# Patient Record
Sex: Female | Born: 1970
Health system: Southern US, Community
[De-identification: ages and names within clinical notes are randomized; demographics above are authoritative.]

## PROBLEM LIST (undated history)

## (undated) DIAGNOSIS — E079 Disorder of thyroid, unspecified: Secondary | ICD-10-CM

## (undated) DIAGNOSIS — F32A Depression, unspecified: Secondary | ICD-10-CM

## (undated) DIAGNOSIS — M79673 Pain in unspecified foot: Secondary | ICD-10-CM

## (undated) DIAGNOSIS — F329 Major depressive disorder, single episode, unspecified: Secondary | ICD-10-CM

## (undated) DIAGNOSIS — I10 Essential (primary) hypertension: Secondary | ICD-10-CM

## (undated) DIAGNOSIS — E119 Type 2 diabetes mellitus without complications: Secondary | ICD-10-CM

## (undated) DIAGNOSIS — J45909 Unspecified asthma, uncomplicated: Secondary | ICD-10-CM

## (undated) DIAGNOSIS — M1990 Unspecified osteoarthritis, unspecified site: Secondary | ICD-10-CM

## (undated) DIAGNOSIS — E669 Obesity, unspecified: Secondary | ICD-10-CM

## (undated) HISTORY — DX: Depression, unspecified: F32.A

## (undated) HISTORY — DX: Unspecified osteoarthritis, unspecified site: M19.90

## (undated) HISTORY — DX: Type 2 diabetes mellitus without complications: E11.9

## (undated) HISTORY — DX: Obesity, unspecified: E66.9

## (undated) HISTORY — DX: Essential (primary) hypertension: I10

## (undated) HISTORY — DX: Pain in unspecified foot: M79.673

## (undated) HISTORY — DX: Major depressive disorder, single episode, unspecified: F32.9

## (undated) HISTORY — DX: Unspecified asthma, uncomplicated: J45.909

## (undated) HISTORY — PX: ANKLE FRACTURE SURGERY: SHX122

---

## 2004-10-29 ENCOUNTER — Ambulatory Visit: Payer: Self-pay | Admitting: Family Medicine

## 2005-05-07 ENCOUNTER — Ambulatory Visit: Payer: Self-pay | Admitting: Internal Medicine

## 2005-06-07 ENCOUNTER — Encounter: Payer: Self-pay | Admitting: Family Medicine

## 2005-06-07 LAB — CONVERTED CEMR LAB

## 2005-12-19 ENCOUNTER — Ambulatory Visit: Payer: Self-pay | Admitting: Family Medicine

## 2006-05-06 ENCOUNTER — Ambulatory Visit: Payer: Self-pay | Admitting: Family Medicine

## 2006-12-18 ENCOUNTER — Ambulatory Visit: Payer: Self-pay | Admitting: Family Medicine

## 2007-01-14 ENCOUNTER — Ambulatory Visit: Payer: Self-pay | Admitting: Family Medicine

## 2007-06-23 ENCOUNTER — Telehealth (INDEPENDENT_AMBULATORY_CARE_PROVIDER_SITE_OTHER): Payer: Self-pay | Admitting: *Deleted

## 2007-07-27 ENCOUNTER — Telehealth (INDEPENDENT_AMBULATORY_CARE_PROVIDER_SITE_OTHER): Payer: Self-pay | Admitting: *Deleted

## 2007-11-17 ENCOUNTER — Encounter: Payer: Self-pay | Admitting: Family Medicine

## 2007-11-17 DIAGNOSIS — F4323 Adjustment disorder with mixed anxiety and depressed mood: Secondary | ICD-10-CM

## 2007-12-01 ENCOUNTER — Ambulatory Visit: Payer: Self-pay | Admitting: Family Medicine

## 2007-12-01 ENCOUNTER — Other Ambulatory Visit: Admission: RE | Admit: 2007-12-01 | Discharge: 2007-12-01 | Payer: Self-pay | Admitting: Family Medicine

## 2007-12-01 ENCOUNTER — Encounter: Payer: Self-pay | Admitting: Family Medicine

## 2007-12-03 LAB — CONVERTED CEMR LAB
AST: 14 units/L (ref 0–37)
Albumin: 3.9 g/dL (ref 3.5–5.2)
Alkaline Phosphatase: 117 units/L (ref 39–117)
BUN: 7 mg/dL (ref 6–23)
Basophils Absolute: 0 10*3/uL (ref 0.0–0.1)
Basophils Relative: 0.5 % (ref 0.0–1.0)
CO2: 30 meq/L (ref 19–32)
Chloride: 101 meq/L (ref 96–112)
Creatinine, Ser: 0.6 mg/dL (ref 0.4–1.2)
HCT: 35.9 % — ABNORMAL LOW (ref 36.0–46.0)
MCHC: 35 g/dL (ref 30.0–36.0)
Neutrophils Relative %: 64.4 % (ref 43.0–77.0)
RBC: 4.16 M/uL (ref 3.87–5.11)
RDW: 12.3 % (ref 11.5–14.6)
Total Bilirubin: 0.7 mg/dL (ref 0.3–1.2)
Total CHOL/HDL Ratio: 4.6
Total Protein: 7.6 g/dL (ref 6.0–8.3)
Triglycerides: 117 mg/dL (ref 0–149)
VLDL: 23 mg/dL (ref 0–40)

## 2007-12-09 DIAGNOSIS — I10 Essential (primary) hypertension: Secondary | ICD-10-CM

## 2007-12-09 HISTORY — DX: Essential (primary) hypertension: I10

## 2007-12-10 ENCOUNTER — Encounter (INDEPENDENT_AMBULATORY_CARE_PROVIDER_SITE_OTHER): Payer: Self-pay | Admitting: *Deleted

## 2008-01-28 ENCOUNTER — Ambulatory Visit: Payer: Self-pay | Admitting: Family Medicine

## 2008-01-28 DIAGNOSIS — I10 Essential (primary) hypertension: Secondary | ICD-10-CM | POA: Insufficient documentation

## 2008-01-28 DIAGNOSIS — R4589 Other symptoms and signs involving emotional state: Secondary | ICD-10-CM

## 2008-02-15 ENCOUNTER — Ambulatory Visit: Payer: Self-pay | Admitting: Family Medicine

## 2008-02-17 ENCOUNTER — Ambulatory Visit: Payer: Self-pay | Admitting: Family Medicine

## 2008-02-17 LAB — CONVERTED CEMR LAB
Basophils Relative: 0.2 % (ref 0.0–1.0)
HDL: 32.7 mg/dL — ABNORMAL LOW (ref >39.0)
LDL Cholesterol: 110 mg/dL — ABNORMAL HIGH (ref 0–99)
Monocytes Relative: 5.5 % (ref 3.0–11.0)
Neutro Abs: 6 10*3/uL (ref 1.4–7.7)
Platelets: 439 10*3/uL — ABNORMAL HIGH (ref 150–400)
RBC: 4.58 M/uL (ref 3.87–5.11)
RDW: 12 % (ref 11.5–14.6)
Total CHOL/HDL Ratio: 5
Triglycerides: 112 mg/dL (ref 0–149)
VLDL: 22 mg/dL (ref 0–40)

## 2008-02-21 ENCOUNTER — Telehealth: Payer: Self-pay | Admitting: Family Medicine

## 2008-06-16 ENCOUNTER — Ambulatory Visit: Payer: Self-pay | Admitting: Family Medicine

## 2008-10-02 ENCOUNTER — Ambulatory Visit: Payer: Self-pay | Admitting: Family Medicine

## 2008-12-21 ENCOUNTER — Ambulatory Visit: Payer: Self-pay | Admitting: Family Medicine

## 2008-12-22 LAB — CONVERTED CEMR LAB
ALT: 16 units/L (ref 0–35)
AST: 17 units/L (ref 0–37)
Albumin: 3.4 g/dL — ABNORMAL LOW (ref 3.5–5.2)
Alkaline Phosphatase: 90 units/L (ref 39–117)
BUN: 7 mg/dL (ref 6–23)
Basophils Absolute: 0.1 10*3/uL (ref 0.0–0.1)
Basophils Relative: 0.8 % (ref 0.0–3.0)
Bilirubin, Direct: 0.1 mg/dL (ref 0.0–0.3)
CO2: 28 meq/L (ref 19–32)
Calcium: 8.7 mg/dL (ref 8.4–10.5)
Chloride: 104 meq/L (ref 96–112)
Cholesterol: 169 mg/dL (ref 0–200)
Creatinine, Ser: 0.6 mg/dL (ref 0.4–1.2)
Eosinophils Absolute: 0.4 10*3/uL (ref 0.0–0.7)
Eosinophils Relative: 4.5 % (ref 0.0–5.0)
GFR calc Af Amer: 143 mL/min
GFR calc non Af Amer: 118 mL/min
Glucose, Bld: 101 mg/dL — ABNORMAL HIGH (ref 70–99)
HCT: 36.5 % (ref 36.0–46.0)
HDL: 36.2 mg/dL — ABNORMAL LOW (ref >39.0)
Hemoglobin: 12.9 g/dL (ref 12.0–15.0)
LDL Cholesterol: 119 mg/dL — ABNORMAL HIGH (ref 0–99)
Lymphocytes Relative: 24.6 % (ref 12.0–46.0)
MCHC: 35.2 g/dL (ref 30.0–36.0)
MCV: 88 fL (ref 78.0–100.0)
Monocytes Absolute: 0.6 10*3/uL (ref 0.1–1.0)
Monocytes Relative: 5.7 % (ref 3.0–12.0)
Neutro Abs: 6.3 10*3/uL (ref 1.4–7.7)
Neutrophils Relative %: 64.4 % (ref 43.0–77.0)
Platelets: 377 10*3/uL (ref 150–400)
Potassium: 3.7 meq/L (ref 3.5–5.1)
RBC: 4.15 M/uL (ref 3.87–5.11)
RDW: 12.4 % (ref 11.5–14.6)
Sodium: 139 meq/L (ref 135–145)
TSH: 1.92 microintl units/mL (ref 0.35–5.50)
Total Bilirubin: 0.7 mg/dL (ref 0.3–1.2)
Total CHOL/HDL Ratio: 4.7
Total Protein: 7.2 g/dL (ref 6.0–8.3)
Triglycerides: 69 mg/dL (ref 0–149)
VLDL: 14 mg/dL (ref 0–40)
WBC: 9.8 10*3/uL (ref 4.5–10.5)

## 2008-12-25 ENCOUNTER — Ambulatory Visit: Payer: Self-pay | Admitting: Family Medicine

## 2008-12-25 DIAGNOSIS — E785 Hyperlipidemia, unspecified: Secondary | ICD-10-CM | POA: Insufficient documentation

## 2009-02-07 ENCOUNTER — Ambulatory Visit: Payer: Self-pay | Admitting: Family Medicine

## 2009-02-07 ENCOUNTER — Encounter: Admission: RE | Admit: 2009-02-07 | Discharge: 2009-02-07 | Payer: Self-pay | Admitting: Family Medicine

## 2009-02-07 DIAGNOSIS — M2020 Hallux rigidus, unspecified foot: Secondary | ICD-10-CM

## 2009-02-07 DIAGNOSIS — M7750 Other enthesopathy of unspecified foot: Secondary | ICD-10-CM

## 2009-02-08 ENCOUNTER — Encounter (INDEPENDENT_AMBULATORY_CARE_PROVIDER_SITE_OTHER): Payer: Self-pay | Admitting: *Deleted

## 2009-03-19 ENCOUNTER — Telehealth (INDEPENDENT_AMBULATORY_CARE_PROVIDER_SITE_OTHER): Payer: Self-pay | Admitting: *Deleted

## 2009-03-20 ENCOUNTER — Ambulatory Visit: Payer: Self-pay | Admitting: Family Medicine

## 2009-04-26 ENCOUNTER — Ambulatory Visit: Payer: Self-pay | Admitting: Family Medicine

## 2009-04-28 ENCOUNTER — Encounter: Admission: RE | Admit: 2009-04-28 | Discharge: 2009-04-28 | Payer: Self-pay | Admitting: Family Medicine

## 2009-05-04 ENCOUNTER — Encounter (INDEPENDENT_AMBULATORY_CARE_PROVIDER_SITE_OTHER): Payer: Self-pay | Admitting: *Deleted

## 2009-05-04 ENCOUNTER — Ambulatory Visit: Payer: Self-pay | Admitting: Sports Medicine

## 2009-11-09 ENCOUNTER — Ambulatory Visit: Payer: Self-pay | Admitting: Family Medicine

## 2009-11-25 ENCOUNTER — Encounter: Payer: Self-pay | Admitting: Family Medicine

## 2010-02-14 ENCOUNTER — Ambulatory Visit: Payer: Self-pay | Admitting: Family Medicine

## 2010-02-14 ENCOUNTER — Encounter (INDEPENDENT_AMBULATORY_CARE_PROVIDER_SITE_OTHER): Payer: Self-pay | Admitting: *Deleted

## 2010-02-14 ENCOUNTER — Other Ambulatory Visit: Admission: RE | Admit: 2010-02-14 | Discharge: 2010-02-14 | Payer: Self-pay | Admitting: Family Medicine

## 2010-02-14 DIAGNOSIS — R195 Other fecal abnormalities: Secondary | ICD-10-CM | POA: Insufficient documentation

## 2010-02-14 DIAGNOSIS — L290 Pruritus ani: Secondary | ICD-10-CM | POA: Insufficient documentation

## 2010-02-15 ENCOUNTER — Encounter: Payer: Self-pay | Admitting: Family Medicine

## 2010-02-15 LAB — CONVERTED CEMR LAB
Albumin: 3.7 g/dL (ref 3.5–5.2)
Alkaline Phosphatase: 98 units/L (ref 39–117)
Basophils Relative: 0.2 % (ref 0.0–3.0)
CO2: 29 meq/L (ref 19–32)
Calcium: 8.7 mg/dL (ref 8.4–10.5)
Chloride: 106 meq/L (ref 96–112)
Eosinophils Absolute: 0.4 10*3/uL (ref 0.0–0.7)
Glucose, Bld: 95 mg/dL (ref 70–99)
HCT: 40.9 % (ref 36.0–46.0)
Hemoglobin: 13.6 g/dL (ref 12.0–15.0)
Lymphocytes Relative: 28.4 % (ref 12.0–46.0)
Lymphs Abs: 2.4 10*3/uL (ref 0.7–4.0)
MCHC: 33.4 g/dL (ref 30.0–36.0)
MCV: 89.5 fL (ref 78.0–100.0)
Neutro Abs: 5.1 10*3/uL (ref 1.4–7.7)
Pap Smear: NORMAL
Potassium: 3.6 meq/L (ref 3.5–5.1)
RBC: 4.56 M/uL (ref 3.87–5.11)
RDW: 12 % (ref 11.5–14.6)
Sodium: 140 meq/L (ref 135–145)
TSH: 1.57 microintl units/mL (ref 0.35–5.50)
Total CHOL/HDL Ratio: 4
Total Protein: 7.7 g/dL (ref 6.0–8.3)

## 2010-02-19 ENCOUNTER — Encounter: Payer: Self-pay | Admitting: Family Medicine

## 2010-02-19 ENCOUNTER — Ambulatory Visit: Payer: Self-pay | Admitting: Family Medicine

## 2010-02-21 ENCOUNTER — Encounter: Payer: Self-pay | Admitting: Family Medicine

## 2010-02-25 ENCOUNTER — Encounter (INDEPENDENT_AMBULATORY_CARE_PROVIDER_SITE_OTHER): Payer: Self-pay | Admitting: *Deleted

## 2010-03-13 ENCOUNTER — Encounter: Payer: Self-pay | Admitting: Internal Medicine

## 2010-03-13 ENCOUNTER — Ambulatory Visit: Payer: Self-pay | Admitting: Gastroenterology

## 2010-03-13 DIAGNOSIS — K625 Hemorrhage of anus and rectum: Secondary | ICD-10-CM

## 2010-04-26 ENCOUNTER — Ambulatory Visit: Payer: Self-pay | Admitting: Gastroenterology

## 2010-04-26 LAB — HM COLONOSCOPY

## 2010-09-27 ENCOUNTER — Ambulatory Visit: Payer: Self-pay | Admitting: Internal Medicine

## 2010-09-27 ENCOUNTER — Encounter (INDEPENDENT_AMBULATORY_CARE_PROVIDER_SITE_OTHER): Payer: Self-pay | Admitting: *Deleted

## 2010-09-27 DIAGNOSIS — J45909 Unspecified asthma, uncomplicated: Secondary | ICD-10-CM | POA: Insufficient documentation

## 2010-10-04 ENCOUNTER — Telehealth: Payer: Self-pay | Admitting: Family Medicine

## 2011-01-07 NOTE — Miscellaneous (Signed)
Summary: pap screening  Clinical Lists Changes  Observations: Added new observation of PAP SMEAR: normal (02/15/2010 15:03)      Preventive Care Screening  Pap Smear:    Date:  02/15/2010    Results:  normal

## 2011-01-07 NOTE — Procedures (Signed)
Summary: Colonoscopy  Patient: Sabrina Palmer Note: All result statuses are Final unless otherwise noted.  Tests: (1) Colonoscopy (COL)   COL Colonoscopy           DONE     [facility]     [address]     [place], [place]  [address]           COLONOSCOPY PROCEDURE REPORT           PATIENT:  Sabrina, Palmer  MR#:  [id number]     BIRTHDATE:  04-11-1971, 38 yrs. old  GENDER:  female           ENDOSCOPIST:  Barbette Hair. Arlyce Dice, MD     Referred by:  Marne A. Milinda Antis, M.D.           PROCEDURE DATE:  04/26/2010     PROCEDURE:     ASA CLASS:  Class I     INDICATIONS:  1) change in bowel habits           MEDICATIONS:   Fentanyl 150 mcg IV, Versed 12 mg IV, Benadryl 50     mg IV           DESCRIPTION OF PROCEDURE:   After the risks benefits and     alternatives of the procedure were thoroughly explained, informed     consent was obtained.  Digital rectal exam was performed and     revealed no abnormalities.   The Pentax Ped Colon [id number]     endoscope was introduced through the anus and advanced to the     cecum, which was identified by the ileocecal valve, limited by     extreme patient discomfort.    The quality of the prep was     excellent, using MoviPrep.  The instrument was then slowly     withdrawn as the colon was fully examined.     <<PROCEDUREIMAGES>>           FINDINGS:  A normal appearing cecum, ileocecal valve, and     appendiceal orifice were identified. The ascending, hepatic     flexure, transverse, splenic flexure, descending, sigmoid colon,     and rectum appeared unremarkable (see image1, image2, image3,     image5, image6, image8, and image9).   Retroflexed views in the     rectum revealed no abnormalities.    The time to cecum =  9.25     minutes. The scope was then withdrawn (time =  6.0  min) from the     patient and the procedure completed.           COMPLICATIONS:  None           ENDOSCOPIC IMPRESSION:     1) Normal colon     RECOMMENDATIONS:  1) high fiber diet     2) call office next 1-3 days to schedule followup visit in 1     month           REPEAT EXAM:  Propofol sedation for any further procedures           ______________________________     Barbette Hair. Arlyce Dice, MD           CC:           n.     eSIGNED:   Barbette Hair. Kaplan at 04/26/2010 09:22 AM           Richardson Chiquito, [id number]  Note: An exclamation mark Marland Kitchen)  indicates a result that was not dispersed into the flowsheet. Document Creation Date: 04/26/2010 1:53 PM _______________________________________________________________________  (1) Order result status: Final Collection or observation date-time: 04/26/2010 09:17 Requested date-time:  Receipt date-time:  Reported date-time:  Referring Physician:   Ordering Physician: Melvia Heaps [phone]) Specimen Source:  Source: Launa Grill Order Number: [id number] Lab site:

## 2011-01-07 NOTE — Letter (Signed)
Summary: Results Follow up Letter  [place] [facility]  [address] [place], [place] [address]   Phone: [phone]  Fax: [phone]    02/21/2010 MRN: [id number]    [facility] [place] [address] [place], [place]  [address]    Dear Ms. Imber,  The following are the results of your recent test(s):  Test         Result    Pap Smear:        Normal __X___  Not Normal _____ Comments: ______________________________________________________ Cholesterol: LDL(Bad cholesterol):         Your goal is less than:         HDL (Good cholesterol):       Your goal is more than: Comments:  ______________________________________________________ Mammogram:        Normal _____  Not Normal _____ Comments:  ___________________________________________________________________ Hemoccult:        Normal _____  Not normal _______ Comments:    _____________________________________________________________________ Other Tests:    We routinely do not discuss normal results over the telephone.  If you desire a copy of the results, or you have any questions about this information we can discuss them at your next office visit.   Sincerely,    Idamae Schuller Lorrane Mccay,MD  MT/ri

## 2011-01-07 NOTE — Letter (Signed)
Summary: Out of Work  [facility]  [address] [place], [place] [address]   Phone: [phone]  Fax: [phone]    September 27, 2010   Employee:  COLBI SCHILTZ    To Whom It May Concern:   For Medical reasons, please excuse the above named employee from work for the following dates:  Start:  September 27, 2010   End:  September 27, 2010   If you need additional information, please feel free to contact our office.         Sincerely,     Selena Batten Dance CMA (AAMA)

## 2011-01-07 NOTE — Letter (Signed)
Summary: Results Follow up Letter  [place] [facility]  [address] [place], [place] [address]   Phone: [phone]  Fax: [phone]    02/25/2010 MRN: [id number]    [facility] [place] [address] [place], [place]  [address]    Dear Ms. Akard,  The following are the results of your recent test(s):  Test         Result    Pap Smear:        Normal ___X__  Not Normal _____ Comments:  Yearly follow up is recommended. ______________________________________________________ Cholesterol: LDL(Bad cholesterol):         Your goal is less than:         HDL (Good cholesterol):       Your goal is more than: Comments:  ______________________________________________________ Mammogram:        Normal _____  Not Normal _____ Comments:  ___________________________________________________________________ Hemoccult:        Normal _____  Not normal _______ Comments:    _____________________________________________________________________ Other Tests:    We routinely do not discuss normal results over the telephone.  If you desire a copy of the results, or you have any questions about this information we can discuss them at your next office visit.   Sincerely,    Marne A. Milinda Antis, M.D.  MAT:lsf

## 2011-01-07 NOTE — Progress Notes (Signed)
Summary: not any better  Phone Note Call from Patient Call back at [phone]   Caller: Patient Summary of Call: Pt was seen on 10/21 for URI and given z-pack, prednisone and inhaler. She said she felt better after a few days but now feels worse again. . She has sore throat, feels like she has a fever, headache.  Should she get more abx?  [facility]. Initial call taken by: Lowella Petties CMA, AAMA,  October 04, 2010 3:05 PM  Follow-up for Phone Call        zpack will cover many things.  will stay in her system for long time.  if ST, to try ibuprofen.  how is cough?  if HA coming from sinuses/mucous, to use neti pot or nasal saline spray for relief.   If not better over weekend to return to be seen.   Follow-up by: Eustaquio Boyden  MD,  October 04, 2010 3:28 PM  Additional Follow-up for Phone Call Additional follow up Details #1::        Spoke with patient. She said cough is MUCH better. She was just concerned about her throat being sore. I advised to take ibuprofen as directed and try the nasal saline spray or neti pot. She said she she would try and call back on Monday if no better. Additional Follow-up by: Janee Morn CMA Duncan Dull),  October 07, 2010 9:10 AM

## 2011-01-07 NOTE — Letter (Signed)
Summary: Results Letter   Gastroenterology  [address] [place], [place] [address]   Phone: [phone]  Fax: [phone]        March 13, 2010 MRN: [id number]    [facility] [place] [address] [place], [place]  [address]    Dear Ms. Sabrina Palmer,  It is my pleasure to have treated you recently as a new patient in my office. I appreciate your confidence and the opportunity to participate in your care.  Since I do have a busy inpatient endoscopy schedule and office schedule, my office hours vary weekly. I am, however, available for emergency calls everyday through my office. If I am not available for an urgent office appointment, another one of our gastroenterologist will be able to assist you.  My well-trained staff are prepared to help you at all times. For emergencies after office hours, a physician from our Gastroenterology section is always available through my 24 hour answering service  Once again I welcome you as a new patient and I look forward to a happy and healthy relationship             Sincerely,  Louis Meckel MD  This letter has been electronically signed by your physician.  Appended Document: Results Letter mailed
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
## 2011-01-07 NOTE — Letter (Signed)
Summary: Results Follow up Letter  [facility]  [address] [place], [place] [address]   Phone: [phone]  Fax: [phone]    02/21/2010 MRN: [id number]    [facility] [address] [place], [place]  [address]    Dear Sabrina Palmer,  The following are the results of your recent test(s):  Test         Result    Pap Smear:        Normal _____  Not Normal _____ Comments: ______________________________________________________ Cholesterol: LDL(Bad cholesterol):         Your goal is less than:         HDL (Good cholesterol):       Your goal is more than: Comments:  ______________________________________________________ Mammogram:        Normal __X___  Not Normal _____ Comments:Please repeat in one year.  ___________________________________________________________________ Hemoccult:        Normal _____  Not normal _______ Comments:    _____________________________________________________________________ Other Tests:    We routinely do not discuss normal results over the telephone.  If you desire a copy of the results, or you have any questions about this information we can discuss them at your next office visit.   Sincerely,    Idamae Schuller Tower,MD  MT/ri

## 2011-01-07 NOTE — Letter (Signed)
Summary: New Patient letter  [facility]  [address] [place], [place] [address]   Phone: [phone]  Fax: [phone]       02/14/2010 MRN: [id number]  [facility] [place] [address] [place], [place]  [address]  Dear Ms. Inglett,  Welcome to the Gastroenterology Division at [facility].    You are scheduled to see Dr.  Melvia Heaps on March 13, 2010 at 3:45pm on the 3rd floor at [facility], [address]  We ask that you try to arrive at our office 15 minutes prior to your appointment time to allow for check-in.  We would like you to complete the enclosed self-administered evaluation form prior to your visit and bring it with you on the day of your appointment.  We will review it with you.  Also, please bring a complete list of all your medications or, if you prefer, bring the medication bottles and we will list them.  Please bring your insurance card so that we may make a copy of it.  If your insurance requires a referral to see a specialist, please bring your referral form from your primary care physician.  Co-payments are due at the time of your visit and may be paid by cash, check or credit card.     Your office visit will consist of a consult with your physician (includes a physical exam), any laboratory testing he/she may order, scheduling of any necessary diagnostic testing (e.g. x-ray, ultrasound, CT-scan), and scheduling of a procedure (e.g. Endoscopy, Colonoscopy) if required.  Please allow enough time on your schedule to allow for any/all of these possibilities.    If you cannot keep your appointment, please call [phone] to cancel or reschedule prior to your appointment date.  This allows [place] the opportunity to schedule an appointment for another patient in need of care.  If you do not cancel or reschedule by 5 p.m. the business day prior to your appointment date, you will be charged a $50.00 late cancellation/no-show fee.    Thank you  for choosing [facility] for your medical needs.  We appreciate the opportunity to care for you.  Please visit [place] at our website  to learn more about our practice.                     Sincerely,                                                             The Gastroenterology Division

## 2011-01-07 NOTE — Assessment & Plan Note (Signed)
Summary: FEVER,COUGH,RUNNY NOSE,HA/CLE   Vital Signs:  Patient profile:   40 year old female Weight:      268.75 pounds O2 Sat:      93 % on Room air Temp:     98.2 degrees F oral Pulse rate:   112 / minute Pulse rhythm:   regular BP sitting:   136 / 80  (left arm) Cuff size:   large  Vitals Entered By: Selena Batten Dance CMA (AAMA) (September 27, 2010 9:06 AM)  O2 Flow:  Room air CC: Fever/cough/runny nose   History of Present Illness: CC: fever, cough  3d h/o fever to 99.8, body aches, chills, trouble breathing, cough.  Took 2 advil at 4:30am.  Took mucinex too.  Sudden onset illness.  + sick contacts at school.  No abd pain, n/v/d.  Drinking ok, no appetite.  "touch of asthma" before with colds.  no h/o PNA.  no smokers at home.  no flu shot yet.  no recent immobility, no hx of or family history of blood clots.    Current Medications (verified): 1)  Cymbalta 60 Mg Cpep (Duloxetine Hcl) .... Take 1 Capsule By Mouth Every Morning 2)  Seasonale 0.15-0.03 Mg  Tabs (Levonorgest-Eth Estrad 91-Day) .... Take By Mouth As Directed 3)  Multivitamins  Tabs (Multiple Vitamin) .... One By Mouth Daily 4)  Diovan Hct 80-12.5 Mg Tabs (Valsartan-Hydrochlorothiazide) .Marland Kitchen.. 1 By Mouth Once Daily  Allergies: 1)  ! * Lisinopril  Past History:  Social History: Last updated: 03/13/2010 Marital Status: Married Children: 1 Occupation: Runner, broadcasting/film/video non smoker alchohol rare  Illicit Drug Use - no Daily Caffeine Use 3-4 cups per day  Past Medical History: Depression HTN 09 foot pain Arthritis Obesity ? RAD with viral illnesses PMH-FH-SH reviewed for relevance  Review of Systems       per HPI  Physical Exam  General:  obese and tired appearing.  speaks in complete sentences Head:  normocephalic, atraumatic, and no abnormalities observed.   Eyes:  vision grossly intact, pupils equal, pupils round, and pupils reactive to light.  no conjunctival pallor, injection or icterus  Ears:  R ear normal  and L ear normal.   Nose:  no nasal discharge.   Mouth:  pharynx pink and moist.   Neck:  supple with full rom and no masses or thyromegally, no JVD or carotid bruit  Lungs:  Normal respiratory effort, chest expands symmetrically. + end exp wheezing.  no rales Heart:  S1 and S2 normal without gallop, murmur, click, rub or other extra sounds.  tachycardic Pulses:  2+ rad pulses Extremities:  no edema.  no palp cords Skin:  Intact without suspicious lesions or rashes lentigos diffusely    Impression & Recommendations:  Problem # 1:  REACTIVE AIRWAY DISEASE (ICD-493.90) likely RAD component brought on by viral infection.  could also be flu but outside window for tamiflu.  advised wash hands and keep mask on while coughing.  Treat as asthmatic bronchitis with albuterol scheduled, prednisone course and zpack.  return if not improving as expected.  discussed has option of UCC eval over weekend if worse.  Discussed red flags to return.  felt better after alb/atrovent neb.  O2 sat not where I'd like it to be.  likely alb/steroid combo will help with this.  Her updated medication list for this problem includes:    Ventolin Hfa 108 (90 Base) Mcg/act Aers (Albuterol sulfate) ..... One puff every 4-6 hours as needed sob/wheezing    Prednisone 20 Mg  Tabs (Prednisone) .Marland Kitchen... Take 2 once daily x 7 days  Pulmonary Functions Reviewed: O2 sat: 93 (09/27/2010)  Complete Medication List: 1)  Cymbalta 60 Mg Cpep (Duloxetine hcl) .... Take 1 capsule by mouth every morning 2)  Seasonale 0.15-0.03 Mg Tabs (Levonorgest-eth estrad 91-day) .... Take by mouth as directed 3)  Multivitamins Tabs (Multiple vitamin) .... One by mouth daily 4)  Diovan Hct 80-12.5 Mg Tabs (Valsartan-hydrochlorothiazide) .Marland Kitchen.. 1 by mouth once daily 5)  Ventolin Hfa 108 (90 Base) Mcg/act Aers (Albuterol sulfate) .... One puff every 4-6 hours as needed sob/wheezing 6)  Prednisone 20 Mg Tabs (Prednisone) .... Take 2 once daily x 7 days 7)   Zithromax Z-pak 250 Mg Tabs (Azithromycin) .... Use as directed  Patient Instructions: 1)  You may have reactive airways from cold. 2)  wash hands and use mask while coughing to prevent spread. 3)  Breathing treatment today. 4)  Treat with course of steroids, albuterol scheduled every 4-6 hours for next several days, and zpack. 5)  Call clinic with questions.  Good to see you today 6)  Get plenty of rest over weekend, push fluids. Prescriptions: ZITHROMAX Z-PAK 250 MG TABS (AZITHROMYCIN) use as directed  #1 x 0   Entered and Authorized by:   Eustaquio Boyden  MD   Signed by:   Eustaquio Boyden  MD on 09/27/2010   Method used:   Electronically to        [facility]* (retail)       [address]       [place], [place]  [address]       Ph: [phone]       Fax: [phone]   RxID:   [id number] PREDNISONE 20 MG TABS (PREDNISONE) take 2 once daily x 7 days  #14 x 0   Entered and Authorized by:   Eustaquio Boyden  MD   Signed by:   Eustaquio Boyden  MD on 09/27/2010   Method used:   Electronically to        [facility]* (retail)       [address]       [place], [place]  [address]       Ph: [phone]       Fax: [phone]   RxID:   [id number] VENTOLIN HFA 108 (90 BASE) MCG/ACT AERS (ALBUTEROL SULFATE) one puff every 4-6 hours as needed sob/wheezing  #1 x 3   Entered and Authorized by:   Eustaquio Boyden  MD   Signed by:   Eustaquio Boyden  MD on 09/27/2010   Method used:   Electronically to        [facility]* (retail)       [address]       [place], [place]  [address]       Ph: [phone]       Fax: [phone]   RxID:   [id number]    Medication Administration  Medication # 1:    Medication: Albuterol Sulfate Sol 1mg  unit dose    Dose: 2.5    Route: inhaled    Exp Date: 02/04/2011    Lot #: [id number]    Mfr: Nephron    Comments: Per Dr. Sharen Hones    Patient tolerated medication without complications    Given by: Selena Batten Dance CMA Duncan Dull)  (September 27, 2010 9:47 AM)  Medication # 2:    Medication: Atrovent 1mg  (Neb)    Dose: 0.5    Route: inhaled    Exp Date: 02/04/2011  Lot #: [id number]    Mfr: Nephron    Comments: Per Dr. Sharen Hones    Patient tolerated medication without complications    Given by: Selena Batten Dance CMA Duncan Dull) (September 27, 2010 9:47 AM)  Orders Added: 1)  Est. Patient Level III [13086]    Current Allergies (reviewed today): ! * LISINOPRIL

## 2011-01-07 NOTE — Letter (Signed)
Summary: [facility] Instructions  [facility]  [address] [place], [place] [address]   Phone: [phone]  Fax: [phone]       [facility]    [birth year]    MRN: [id number]        Procedure Day Dorna Bloom:  Farrell Ours, 03/22/10       Arrival Time:8:00 AM     Procedure Time:9:00 AM     Location of Procedure:                    [facility] (4th Floor)                        PREPARATION FOR COLONOSCOPY WITH MOVIPREP   Starting 5 days prior to your procedure 03/17/10 do not eat nuts, seeds, popcorn, corn, beans, peas,  salads, or any raw vegetables.  Do not take any fiber supplements (e.g. Metamucil, Citrucel, and Benefiber).  THE DAY BEFORE YOUR PROCEDURE         DATE: 03/21/10  DAY: THURSDAY  1.  Drink clear liquids the entire day-NO SOLID FOOD  2.  Do not drink anything colored red or purple.  Avoid juices with pulp.  No orange juice.  3.  Drink at least 64 oz. (8 glasses) of fluid/clear liquids during the day to prevent dehydration and help the prep work efficiently.  CLEAR LIQUIDS INCLUDE: Water Jello Ice Popsicles Tea (sugar ok, no milk/cream) Powdered fruit flavored drinks Coffee (sugar ok, no milk/cream) Gatorade Juice: apple, white grape, white cranberry  Lemonade Clear bullion, consomm, broth Carbonated beverages (any kind) Strained chicken noodle soup Hard Candy                             4.  In the morning, mix first dose of MoviPrep solution:    Empty 1 Pouch A and 1 Pouch B into the disposable container    Add lukewarm drinking water to the top line of the container. Mix to dissolve    Refrigerate (mixed solution should be used within 24 hrs)  5.  Begin drinking the prep at 5:00 p.m. The MoviPrep container is divided by 4 marks.   Every 15 minutes drink the solution down to the next mark (approximately 8 oz) until the full liter is complete.   6.  Follow completed prep with 16 oz of clear liquid of your choice (Nothing  red or purple).  Continue to drink clear liquids until bedtime.  7.  Before going to bed, mix second dose of MoviPrep solution:    Empty 1 Pouch A and 1 Pouch B into the disposable container    Add lukewarm drinking water to the top line of the container. Mix to dissolve    Refrigerate  THE DAY OF YOUR PROCEDURE      DATE: 03/22/10 DAY: FRIDAY  Beginning at 4:00 a.m. (5 hours before procedure):         1. Every 15 minutes, drink the solution down to the next mark (approx 8 oz) until the full liter is complete.  2. Follow completed prep with 16 oz. of clear liquid of your choice.    3. You may drink clear liquids until 7:00 AM (2 HOURS BEFORE PROCEDURE).   MEDICATION INSTRUCTIONS  Unless otherwise instructed, you should take regular prescription medications with a small sip of water   as early as possible the morning of your  procedure.        OTHER INSTRUCTIONS  You will need a responsible adult at least 42 years of age to accompany you and drive you home.   This person must remain in the waiting room during your procedure.  Wear loose fitting clothing that is easily removed.  Leave jewelry and other valuables at home.  However, you may wish to bring a book to read or  an iPod/MP3 player to listen to music as you wait for your procedure to start.  Remove all body piercing jewelry and leave at home.  Total time from sign-in until discharge is approximately 2-3 hours.  You should go home directly after your procedure and rest.  You can resume normal activities the  day after your procedure.  The day of your procedure you should not:   Drive   Make legal decisions   Operate machinery   Drink alcohol   Return to work  You will receive specific instructions about eating, activities and medications before you leave.    The above instructions have been reviewed and explained to me by   _______________________    I fully understand and can verbalize these  instructions _____________________________ Date _________

## 2011-01-07 NOTE — Miscellaneous (Signed)
Summary: mammogram screening  Clinical Lists Changes  Observations: Added new observation of MAMMO DUE: 02/2011 (02/21/2010 15:18) Added new observation of MAMMOGRAM: normal (02/19/2010 15:19)      Preventive Care Screening  Mammogram:    Date:  02/19/2010    Next Due:  02/2011    Results:  normal

## 2011-02-28 ENCOUNTER — Other Ambulatory Visit: Payer: Self-pay | Admitting: *Deleted

## 2011-02-28 MED ORDER — VALSARTAN-HYDROCHLOROTHIAZIDE 80-12.5 MG PO TABS: 1.0000 | ORAL_TABLET | Freq: Every day | ORAL | Status: DC

## 2011-02-28 NOTE — Telephone Encounter (Signed)
Refill requested

## 2011-03-22 ENCOUNTER — Emergency Department: Payer: Self-pay | Admitting: Emergency Medicine

## 2011-03-24 ENCOUNTER — Telehealth: Payer: Self-pay | Admitting: *Deleted

## 2011-03-24 MED ORDER — LEVONORGEST-ETH ESTRAD 91-DAY 0.15-0.03 MG PO TABS: ORAL_TABLET | ORAL | Status: DC

## 2011-03-24 NOTE — Telephone Encounter (Signed)
Patient notified as instructed by telephone.  Pt said fever is down, still feeling very week, and still coughing up greenish brown phlegm. Pt scheduled 30 min f/u appt with Dr Milinda Antis 04/08/11 at 3:15pm.

## 2011-03-24 NOTE — Telephone Encounter (Signed)
That sounds very reasonable to me --- I will rev records when I get them  Send this back please when you get them and ask her how she is feeling- thanks Will need to follow up within next 7-10 days

## 2011-03-24 NOTE — Telephone Encounter (Signed)
Pt was seen at [facility] ER on Saturday for pneumonia.  She was given levoquin 500 mg's, one a day for 7 days.  She is asking if you think that is the proper treatment.  She thinks she was given abx and steroids through her IV.  She would like for you to review her ER notes and let her know if you agree with the plans.  I have faxed a request to [facility] medical records for the notes.

## 2011-04-01 ENCOUNTER — Encounter: Payer: Self-pay | Admitting: Family Medicine

## 2011-04-01 ENCOUNTER — Other Ambulatory Visit: Payer: Self-pay | Admitting: *Deleted

## 2011-04-01 DIAGNOSIS — F329 Major depressive disorder, single episode, unspecified: Secondary | ICD-10-CM

## 2011-04-01 MED ORDER — DULOXETINE HCL 60 MG PO CPEP: 60.0000 mg | ORAL_CAPSULE | Freq: Every day | ORAL | Status: DC

## 2011-04-01 NOTE — Telephone Encounter (Signed)
Medication phoned to [facility] as instructed.  

## 2011-04-01 NOTE — Telephone Encounter (Signed)
Px written for call in   

## 2011-04-04 ENCOUNTER — Encounter: Payer: Self-pay | Admitting: Family Medicine

## 2011-04-08 ENCOUNTER — Encounter: Payer: Self-pay | Admitting: Family Medicine

## 2011-04-08 ENCOUNTER — Ambulatory Visit (INDEPENDENT_AMBULATORY_CARE_PROVIDER_SITE_OTHER): Payer: BC Managed Care – PPO | Admitting: Family Medicine

## 2011-04-08 DIAGNOSIS — E876 Hypokalemia: Secondary | ICD-10-CM

## 2011-04-08 DIAGNOSIS — J189 Pneumonia, unspecified organism: Secondary | ICD-10-CM

## 2011-04-08 DIAGNOSIS — R4586 Emotional lability: Secondary | ICD-10-CM

## 2011-04-08 DIAGNOSIS — F39 Unspecified mood [affective] disorder: Secondary | ICD-10-CM

## 2011-04-08 NOTE — Assessment & Plan Note (Signed)
With strong fam hx of bipolar at this time - incl mother and son Pt has experienced and been tx for depression for years Now recognizing past and present symptoms that could be consistent with bipolar like moodiness/ irritability/ poss mania in the summer She is contemplating psychiatry consult - and will contact her ins before asking for consult  Overall good coping skills with stress of caring for son with bipolar

## 2011-04-08 NOTE — Patient Instructions (Addendum)
Checking blood count today as well as potassium  I'm glad you are feeling better  Drink lots of fluids and work on getting enough sleep  Update me if any symptoms return  If you are interested in a work up for bipolar- check with your insurance to see who is a covered psychiatrist in the area and let me know

## 2011-04-08 NOTE — Assessment & Plan Note (Signed)
k was very slt low in hosp at 3.2 -- re check that  At time was likely dehydrated No cramping or other symptoms

## 2011-04-08 NOTE — Progress Notes (Signed)
  Subjective:    Patient ID: Sabrina Palmer, female    DOB: October 21, 1971, 40 y.o.   MRN: [id number]  HPI Here for f/u of episode at [facility] ER thought to be pneumonia  Pt was there April 14th with uri and chest tightness and sob-- started at walk in clinic at [facility] and they sent her to ER  cxr nl but wbc was 23.4 VQ scan was neg / though DDimer high  K 3.4 Sinus tach on ekg no acute changes  Blood cx neg   Gave her some antibiotic and steroid? In her IV  Then oral med daily for 7 days Levaquin generic   Is still weak and tired -- not back to normal  Chest feels better  Just a little bit of coughing  99.1 -- today -- no chills or sweats  Is drinking lots of fluids  Not sleeping well -- though she is so tired   Dealing with a lot of personal stress Her 53 year old son -- was dx with bipolar in feb  Is getting under control   She wonders herself if she has bipolar too after doing some reading Wild mood swings since teen years- she gets angry and irritable easily  When younger took some risks and spent money carelessly as well ? If mania at times -- more energy in summer - lots of projects/ wears herself down and brain will not turn off  Is tx for depression now  Is thinking about whether she needs to see psychiatry     Review of Systems Review of Systems  Constitutional: Negative for fever, appetite change, and unexpected weight change. pos for fatigue and gen weakness Eyes: Negative for pain and visual disturbance.  Respiratory: Negative for cough and shortness of breath.   Cardiovascular: Negative.   Gastrointestinal: Negative for nausea, diarrhea and constipation.  Genitourinary: Negative for urgency and frequency.  Skin: Negative for pallor.  Neurological: Negative for weakness, light-headedness, numbness and headaches.  Hematological: Negative for adenopathy. Does not bruise/bleed easily.  Psychiatric/Behavioral: Negative for SI or appetite change        Objective:    Physical Exam  Constitutional: She appears well-developed and well-nourished. No distress.       overwt and well appearing   HENT:  Head: Normocephalic and atraumatic.  Eyes: Conjunctivae and EOM are normal. Pupils are equal, round, and reactive to light.  Neck: Normal range of motion. Neck supple. No JVD present. No thyromegaly present.  Cardiovascular: Normal rate, regular rhythm and normal heart sounds.   Pulmonary/Chest: Effort normal and breath sounds normal. No respiratory distress. She has no wheezes.  Abdominal: Soft. Bowel sounds are normal. She exhibits no distension and no mass. There is no tenderness.  Musculoskeletal: Normal range of motion. She exhibits no edema and no tenderness.  Lymphadenopathy:    She has no cervical adenopathy.  Neurological: No cranial nerve deficit.  Skin: Skin is warm and dry. No rash noted. No erythema. No pallor.  Psychiatric: She has a normal mood and affect. Her behavior is normal. Thought content normal.          Assessment & Plan:

## 2011-04-08 NOTE — Assessment & Plan Note (Signed)
Reviewed hosp notes and labs in detail ? Pneumonia vs bronchitis - cxr was neg but high wbc Clinically improved with resolved cough and sob  Still tired which is not unexpected  Re check cbc with diff today  Update if no further imp in 2 wk (fatigue could also be coming from some stress at home)

## 2011-04-09 LAB — CBC WITH DIFFERENTIAL/PLATELET
Eosinophils Relative: 3.4 % (ref 0.0–5.0)
HCT: 38.9 % (ref 36.0–46.0)
Hemoglobin: 13 g/dL (ref 12.0–15.0)
Lymphs Abs: 2.7 10*3/uL (ref 0.7–4.0)
MCV: 89.5 fl (ref 78.0–100.0)
Monocytes Relative: 4.6 % (ref 3.0–12.0)
Neutro Abs: 8.6 10*3/uL — ABNORMAL HIGH (ref 1.4–7.7)
RDW: 13.1 % (ref 11.5–14.6)
WBC: 12.3 10*3/uL — ABNORMAL HIGH (ref 4.5–10.5)

## 2011-05-13 ENCOUNTER — Other Ambulatory Visit (INDEPENDENT_AMBULATORY_CARE_PROVIDER_SITE_OTHER): Payer: BC Managed Care – PPO | Admitting: Family Medicine

## 2011-05-13 DIAGNOSIS — D72829 Elevated white blood cell count, unspecified: Secondary | ICD-10-CM

## 2011-05-14 LAB — CBC WITH DIFFERENTIAL/PLATELET
Basophils Absolute: 0.1 10*3/uL (ref 0.0–0.1)
Eosinophils Absolute: 0.4 10*3/uL (ref 0.0–0.7)
HCT: 38.9 % (ref 36.0–46.0)
Hemoglobin: 13.1 g/dL (ref 12.0–15.0)
Lymphs Abs: 3.3 10*3/uL (ref 0.7–4.0)
MCHC: 33.8 g/dL (ref 30.0–36.0)
Monocytes Absolute: 0.9 10*3/uL (ref 0.1–1.0)
Neutro Abs: 8.8 10*3/uL — ABNORMAL HIGH (ref 1.4–7.7)
RDW: 13.1 % (ref 11.5–14.6)

## 2011-05-18 ENCOUNTER — Telehealth: Payer: Self-pay | Admitting: Family Medicine

## 2011-05-18 DIAGNOSIS — I10 Essential (primary) hypertension: Secondary | ICD-10-CM

## 2011-05-18 DIAGNOSIS — E785 Hyperlipidemia, unspecified: Secondary | ICD-10-CM

## 2011-05-18 DIAGNOSIS — Z0000 Encounter for general adult medical examination without abnormal findings: Secondary | ICD-10-CM

## 2011-05-18 NOTE — Telephone Encounter (Signed)
Message copied by Judy Pimple on Sun May 18, 2011  7:26 PM ------      Message from: Sabrina Palmer      Created: Wed May 14, 2011  9:26 AM      Regarding: Cpx labs Wed       Please order  future cpx labs for pt's upcomming lab appt.      Thanks      Rodney Booze

## 2011-05-21 ENCOUNTER — Other Ambulatory Visit (INDEPENDENT_AMBULATORY_CARE_PROVIDER_SITE_OTHER): Payer: BC Managed Care – PPO | Admitting: Family Medicine

## 2011-05-21 DIAGNOSIS — Z0000 Encounter for general adult medical examination without abnormal findings: Secondary | ICD-10-CM

## 2011-05-21 DIAGNOSIS — E785 Hyperlipidemia, unspecified: Secondary | ICD-10-CM

## 2011-05-21 LAB — CBC WITH DIFFERENTIAL/PLATELET
Basophils Absolute: 0 10*3/uL (ref 0.0–0.1)
HCT: 37.9 % (ref 36.0–46.0)
Lymphocytes Relative: 28 % (ref 12.0–46.0)
Lymphs Abs: 2.6 10*3/uL (ref 0.7–4.0)
Monocytes Relative: 5.2 % (ref 3.0–12.0)
Platelets: 398 10*3/uL (ref 150.0–400.0)
RDW: 13.1 % (ref 11.5–14.6)

## 2011-05-21 LAB — COMPREHENSIVE METABOLIC PANEL
ALT: 16 U/L (ref 0–35)
CO2: 27 mEq/L (ref 19–32)
GFR: 116.54 mL/min (ref >60.00)
Sodium: 139 mEq/L (ref 135–145)
Total Bilirubin: 0.4 mg/dL (ref 0.3–1.2)
Total Protein: 7.3 g/dL (ref 6.0–8.3)

## 2011-05-21 LAB — LIPID PANEL
HDL: 38 mg/dL — ABNORMAL LOW (ref >39.00)
LDL Cholesterol: 124 mg/dL — ABNORMAL HIGH (ref 0–99)
Total CHOL/HDL Ratio: 5
VLDL: 16.2 mg/dL (ref 0.0–40.0)

## 2011-05-27 ENCOUNTER — Encounter: Payer: Self-pay | Admitting: Family Medicine

## 2011-05-27 ENCOUNTER — Ambulatory Visit (INDEPENDENT_AMBULATORY_CARE_PROVIDER_SITE_OTHER): Payer: BC Managed Care – PPO | Admitting: Family Medicine

## 2011-05-27 DIAGNOSIS — E785 Hyperlipidemia, unspecified: Secondary | ICD-10-CM

## 2011-05-27 DIAGNOSIS — R4586 Emotional lability: Secondary | ICD-10-CM

## 2011-05-27 DIAGNOSIS — F3289 Other specified depressive episodes: Secondary | ICD-10-CM

## 2011-05-27 DIAGNOSIS — Z1231 Encounter for screening mammogram for malignant neoplasm of breast: Secondary | ICD-10-CM

## 2011-05-27 DIAGNOSIS — Z0000 Encounter for general adult medical examination without abnormal findings: Secondary | ICD-10-CM

## 2011-05-27 DIAGNOSIS — R4589 Other symptoms and signs involving emotional state: Secondary | ICD-10-CM

## 2011-05-27 DIAGNOSIS — F39 Unspecified mood [affective] disorder: Secondary | ICD-10-CM

## 2011-05-27 DIAGNOSIS — F329 Major depressive disorder, single episode, unspecified: Secondary | ICD-10-CM

## 2011-05-27 DIAGNOSIS — I10 Essential (primary) hypertension: Secondary | ICD-10-CM

## 2011-05-27 NOTE — Assessment & Plan Note (Signed)
Better on 2nd check  No problems  Rev meds Rev labs Enc wt loss

## 2011-05-27 NOTE — Patient Instructions (Addendum)
We will refer you to psychiatry at check out We will refer you for mammogram at check out  Please work on healthy diet and exercise for weight loss Blood pressure is better on 2nd check
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
## 2011-05-27 NOTE — Assessment & Plan Note (Signed)
Mam ordered  Nl exam today Enc regular self exams

## 2011-05-27 NOTE — Assessment & Plan Note (Signed)
Refer for psych eval fam hx bipolar She is worried about this with herself/ moodiness/ irritability

## 2011-05-27 NOTE — Assessment & Plan Note (Signed)
Reviewed health habits including diet and exercise and skin cancer prevention Also reviewed health mt list, fam hx and immunizations  Rev wellness labs in detail Rev need for wt loss for general health- ? If motivated

## 2011-05-27 NOTE — Assessment & Plan Note (Signed)
Up just a bit but still fairly controlled Rev lab with pt in detail Rev low sat fat diet in detail

## 2011-06-05 ENCOUNTER — Other Ambulatory Visit: Payer: Self-pay | Admitting: *Deleted

## 2011-06-05 MED ORDER — VALSARTAN-HYDROCHLOROTHIAZIDE 80-12.5 MG PO TABS: 1.0000 | ORAL_TABLET | Freq: Every day | ORAL | Status: DC

## 2011-07-09 ENCOUNTER — Ambulatory Visit: Payer: Self-pay | Admitting: Family Medicine

## 2011-07-09 LAB — HM MAMMOGRAPHY: HM Mammogram: NORMAL

## 2011-07-11 ENCOUNTER — Encounter: Payer: Self-pay | Admitting: Family Medicine

## 2011-09-24 ENCOUNTER — Encounter: Payer: Self-pay | Admitting: Family Medicine

## 2011-09-24 ENCOUNTER — Ambulatory Visit (INDEPENDENT_AMBULATORY_CARE_PROVIDER_SITE_OTHER): Payer: BC Managed Care – PPO | Admitting: Family Medicine

## 2011-09-24 VITALS — BP 132/84 | HR 84 | Temp 98.8°F | Ht 67.0 in | Wt 263.0 lb

## 2011-09-24 DIAGNOSIS — R4589 Other symptoms and signs involving emotional state: Secondary | ICD-10-CM

## 2011-09-24 DIAGNOSIS — Z23 Encounter for immunization: Secondary | ICD-10-CM

## 2011-09-24 MED ORDER — ALPRAZOLAM 0.5 MG PO TABS: 0.5000 mg | ORAL_TABLET | Freq: Every day | ORAL | Status: AC | PRN

## 2011-09-24 NOTE — Patient Instructions (Signed)
Avoid caffeine Get as much exercise as you can  We will do a counseling referral at check out  Keep xanax on hand if you have a bad panic attack - but caution it will sedate and impair driving
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
## 2011-09-25 NOTE — Assessment & Plan Note (Signed)
Now manifesting with more anxiety symptoms and new panic attack  Pt very worried she will have another one I feel she is also mildly depressed (but not bipolar) Disc stressors for a long time Disc coping tech/ tx opt / cymbalta effects/ poss side eff Feel she would benefit most from counseling  Will refer >25 min spent with face to face with patient, >50% counseling and/or coordinating care

## 2011-10-09 ENCOUNTER — Other Ambulatory Visit: Payer: Self-pay

## 2011-10-09 DIAGNOSIS — F329 Major depressive disorder, single episode, unspecified: Secondary | ICD-10-CM

## 2011-10-09 NOTE — Telephone Encounter (Signed)
[facility] faxed prescription transfer to Dr Milinda Antis for Cymbalta 60 mg. Trasnfer request is on your shelf in the in box and pt request call at [phone] when refilll sent in. Pt has one pill for Friday.

## 2011-10-10 MED ORDER — DULOXETINE HCL 60 MG PO CPEP: 60.0000 mg | ORAL_CAPSULE | Freq: Every day | ORAL | Status: DC

## 2011-10-10 NOTE — Telephone Encounter (Signed)
Will refill electronically  

## 2011-10-14 ENCOUNTER — Ambulatory Visit (INDEPENDENT_AMBULATORY_CARE_PROVIDER_SITE_OTHER): Payer: BC Managed Care – PPO | Admitting: Psychology

## 2011-10-14 DIAGNOSIS — F331 Major depressive disorder, recurrent, moderate: Secondary | ICD-10-CM

## 2011-11-05 ENCOUNTER — Ambulatory Visit: Payer: BC Managed Care – PPO | Admitting: Psychology

## 2011-12-26 DIAGNOSIS — Z0279 Encounter for issue of other medical certificate: Secondary | ICD-10-CM

## 2011-12-29 ENCOUNTER — Telehealth: Payer: Self-pay

## 2011-12-29 NOTE — Telephone Encounter (Signed)
Pt dropped off renewal form for handicapped placard that is on your shelf in the in box. Please call pt when ready for pick up at [phone].

## 2011-12-29 NOTE — Telephone Encounter (Signed)
Done and in IN box 

## 2011-12-30 NOTE — Telephone Encounter (Signed)
Patient notified as instructed by telephone.Form left at front desk. Completed form sent to be scanned.

## 2012-03-27 ENCOUNTER — Other Ambulatory Visit: Payer: Self-pay | Admitting: Family Medicine

## 2012-05-14 ENCOUNTER — Encounter: Payer: Self-pay | Admitting: Family Medicine

## 2012-05-14 ENCOUNTER — Ambulatory Visit (INDEPENDENT_AMBULATORY_CARE_PROVIDER_SITE_OTHER): Payer: BC Managed Care – PPO | Admitting: Family Medicine

## 2012-05-14 VITALS — BP 134/88 | HR 86 | Temp 98.3°F | Ht 66.75 in | Wt 259.8 lb

## 2012-05-14 DIAGNOSIS — B9689 Other specified bacterial agents as the cause of diseases classified elsewhere: Secondary | ICD-10-CM | POA: Insufficient documentation

## 2012-05-14 DIAGNOSIS — J019 Acute sinusitis, unspecified: Secondary | ICD-10-CM

## 2012-05-14 MED ORDER — AMOXICILLIN-POT CLAVULANATE 875-125 MG PO TABS: 1.0000 | ORAL_TABLET | Freq: Two times a day (BID) | ORAL | Status: AC

## 2012-05-14 MED ORDER — ALBUTEROL SULFATE HFA 108 (90 BASE) MCG/ACT IN AERS: 2.0000 | INHALATION_SPRAY | RESPIRATORY_TRACT | Status: DC | PRN

## 2012-05-14 MED ORDER — GUAIFENESIN-CODEINE 100-10 MG/5ML PO SYRP: 5.0000 mL | ORAL_SOLUTION | Freq: Four times a day (QID) | ORAL | Status: AC | PRN
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
## 2012-05-14 NOTE — Patient Instructions (Signed)
Drink lots of fluids Try some nasal saline spray for congestion and breathe steam Take augmentin for sinus infection  Cough syrup with caution - may sedate  If wheezing worsens - let me know - I sent inhaler px as well

## 2012-05-14 NOTE — Assessment & Plan Note (Signed)
After uri - head and chest cong for over a week and some reactive airways tx with augmentin Refilled albuterol  Cough syrup - guifen- cod  Disc symptomatic care - see instructions on AVS  Update if not starting to improve in a week or if worsening

## 2012-06-21 ENCOUNTER — Other Ambulatory Visit: Payer: Self-pay | Admitting: *Deleted

## 2012-06-21 MED ORDER — VALSARTAN-HYDROCHLOROTHIAZIDE 80-12.5 MG PO TABS: 1.0000 | ORAL_TABLET | Freq: Every day | ORAL | Status: DC

## 2012-07-22 ENCOUNTER — Other Ambulatory Visit: Payer: Self-pay

## 2012-07-22 MED ORDER — VALSARTAN-HYDROCHLOROTHIAZIDE 80-12.5 MG PO TABS: 1.0000 | ORAL_TABLET | Freq: Every day | ORAL | Status: DC

## 2012-07-22 NOTE — Telephone Encounter (Signed)
Will refill electronically  

## 2012-07-22 NOTE — Telephone Encounter (Signed)
I think I just did this?

## 2012-07-22 NOTE — Telephone Encounter (Signed)
Request for Valsartan HCTZ 80-12.5 mg Last Ov was 05/14/12. Last filled 06/21/12. Ok to refill?

## 2012-07-22 NOTE — Telephone Encounter (Signed)
Request for Valsartan Hydrochlorothiazide 80-12.5 Last Ov 05/14/12. Last filled 06/21/12. Labs 05/21/11. Ok to refill?

## 2012-07-23 ENCOUNTER — Other Ambulatory Visit: Payer: Self-pay

## 2012-08-06 ENCOUNTER — Other Ambulatory Visit: Payer: Self-pay | Admitting: Gastroenterology

## 2012-10-22 ENCOUNTER — Other Ambulatory Visit: Payer: Self-pay | Admitting: Family Medicine

## 2012-10-25 NOTE — Telephone Encounter (Signed)
CPE scheduled for Dec. and Rx refilled

## 2012-10-25 NOTE — Telephone Encounter (Signed)
Can refil that for 6 months please  She is due for a physical, I think -please schedule that when able  thanks

## 2012-10-25 NOTE — Telephone Encounter (Signed)
Ok to refill 

## 2012-12-03 ENCOUNTER — Ambulatory Visit (INDEPENDENT_AMBULATORY_CARE_PROVIDER_SITE_OTHER): Payer: 59 | Admitting: Family Medicine

## 2012-12-03 ENCOUNTER — Other Ambulatory Visit (HOSPITAL_COMMUNITY)
Admission: RE | Admit: 2012-12-03 | Discharge: 2012-12-03 | Disposition: A | Discharge status: Home / Self Care | Payer: 59 | Source: Ambulatory Visit | Attending: Family Medicine | Admitting: Family Medicine

## 2012-12-03 ENCOUNTER — Encounter: Payer: Self-pay | Admitting: Family Medicine

## 2012-12-03 VITALS — BP 130/80 | HR 110 | Temp 96.5°F | Ht 67.0 in | Wt 272.0 lb

## 2012-12-03 DIAGNOSIS — Z01419 Encounter for gynecological examination (general) (routine) without abnormal findings: Secondary | ICD-10-CM

## 2012-12-03 DIAGNOSIS — M6740 Ganglion, unspecified site: Secondary | ICD-10-CM

## 2012-12-03 DIAGNOSIS — Z0000 Encounter for general adult medical examination without abnormal findings: Secondary | ICD-10-CM

## 2012-12-03 DIAGNOSIS — Z1231 Encounter for screening mammogram for malignant neoplasm of breast: Secondary | ICD-10-CM

## 2012-12-03 DIAGNOSIS — F3289 Other specified depressive episodes: Secondary | ICD-10-CM

## 2012-12-03 DIAGNOSIS — E785 Hyperlipidemia, unspecified: Secondary | ICD-10-CM

## 2012-12-03 DIAGNOSIS — F329 Major depressive disorder, single episode, unspecified: Secondary | ICD-10-CM

## 2012-12-03 DIAGNOSIS — E669 Obesity, unspecified: Secondary | ICD-10-CM

## 2012-12-03 DIAGNOSIS — M67439 Ganglion, unspecified wrist: Secondary | ICD-10-CM

## 2012-12-03 DIAGNOSIS — I10 Essential (primary) hypertension: Secondary | ICD-10-CM

## 2012-12-03 LAB — LIPID PANEL
Cholesterol: 165 mg/dL (ref 0–200)
LDL Cholesterol: 109 mg/dL — ABNORMAL HIGH (ref 0–99)

## 2012-12-03 LAB — CBC WITH DIFFERENTIAL/PLATELET
Basophils Absolute: 0 10*3/uL (ref 0.0–0.1)
HCT: 41.3 % (ref 36.0–46.0)
Lymphocytes Relative: 23.9 % (ref 12.0–46.0)
Lymphs Abs: 2.4 10*3/uL (ref 0.7–4.0)
Monocytes Relative: 5.5 % (ref 3.0–12.0)
Platelets: 408 10*3/uL — ABNORMAL HIGH (ref 150.0–400.0)
RDW: 13.1 % (ref 11.5–14.6)

## 2012-12-03 LAB — COMPREHENSIVE METABOLIC PANEL
ALT: 21 U/L (ref 0–35)
CO2: 25 mEq/L (ref 19–32)
GFR: 107.39 mL/min (ref >60.00)
Sodium: 138 mEq/L (ref 135–145)
Total Bilirubin: 0.5 mg/dL (ref 0.3–1.2)
Total Protein: 8 g/dL (ref 6.0–8.3)

## 2012-12-03 MED ORDER — DULOXETINE HCL 60 MG PO CPEP: 60.0000 mg | ORAL_CAPSULE | Freq: Every day | ORAL | Status: DC

## 2012-12-03 MED ORDER — VALSARTAN-HYDROCHLOROTHIAZIDE 80-12.5 MG PO TABS: 1.0000 | ORAL_TABLET | Freq: Every day | ORAL | Status: DC

## 2012-12-03 MED ORDER — LEVONORGEST-ETH ESTRAD 91-DAY 0.15-0.03 MG PO TABS: 1.0000 | ORAL_TABLET | Freq: Every day | ORAL | Status: DC

## 2012-12-03 NOTE — Assessment & Plan Note (Signed)
Pt will make own appt at [facility] breast exam  enc self exams monthly

## 2012-12-03 NOTE — Patient Instructions (Addendum)
Don't forget to call the [place] [facility] and schedule your annual mammogram I highly recommend the weight watchers program and start exercising If the knot on your wrist grows or becomes painful please let me know  Labs today

## 2012-12-03 NOTE — Assessment & Plan Note (Signed)
Lab today Disc sat fats in diet Not fasting today- aware Plans to join wt watchers

## 2012-12-03 NOTE — Assessment & Plan Note (Signed)
Annual exam with pap  Refilled oc No problems

## 2012-12-03 NOTE — Assessment & Plan Note (Signed)
Discussed how this problem influences overall health and the risks it imposes  Reviewed plan for weight loss with lower calorie diet (via better food choices and also portion control or program like weight watchers) and exercise building up to or more than 30 minutes 5 days per week including some aerobic activity   Pt plans to join wt watchers-enc her

## 2012-12-03 NOTE — Assessment & Plan Note (Signed)
Reviewed health habits including diet and exercise and skin cancer prevention Also reviewed health mt list, fam hx and immunizations  Lab today

## 2012-12-03 NOTE — Progress Notes (Signed)
Subjective:    Patient ID: Sabrina Palmer, female    DOB: [birth year], 41 y.o.   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  Is still drinking all diet/ non call drink  Really likes her job  Has a recumbant bike at home- will start working on that  Is thinking about joining wt watchers  bp is up a bit  today  No cp or palpitations or headaches or edema  No side effects to medicines  occas checks her bp at home  BP Readings from Last 3 Encounters:  12/03/12 148/74  05/14/12 134/88  09/24/11 132/84     Flu vaccine- had that in Oct   Pap 3/11- thinks that was when  She does not see gyn  No gyn issues or period problems   mammo 8/12 Self exam- goes [place]  Self exam -no lumps   She has a little knot on R wrist   colonosc 5/11  Needs labs - is due for those  This am - did eat - egg mc muffin- hash brown -may make that high  Still wants labs  Lab Results  Component Value Date   CHOL 178 05/21/2011   HDL 38.00* 05/21/2011   LDLCALC 124* 05/21/2011   LDLDIRECT 139.5 12/01/2007   TRIG 81.0 05/21/2011   CHOLHDL 5 05/21/2011     Review of Systems Review of Systems  Constitutional: Negative for fever, appetite change, fatigue and unexpected weight change.  Eyes: Negative for pain and visual disturbance.  Respiratory: Negative for cough and shortness of breath.   Cardiovascular: Negative for cp or palpitations    Gastrointestinal: Negative for nausea, diarrhea and constipation.  Genitourinary: Negative for urgency and frequency.  Skin: Negative for pallor or rash   Neurological: Negative for weakness, light-headedness, numbness and headaches.  Hematological: Negative for adenopathy. Does not  bruise/bleed easily.  Psychiatric/Behavioral: Negative for dysphoric mood. The patient is not nervous/anxious.         Objective:   Physical Exam  Constitutional: She appears well-developed and well-nourished. No distress.       obese and well appearing   HENT:  Head: Normocephalic and atraumatic.  Right Ear: External ear normal.  Left Ear: External ear normal.  Nose: Nose normal.  Mouth/Throat: Oropharynx is clear and moist. No oropharyngeal exudate.  Eyes: Conjunctivae normal and EOM are normal. Pupils are equal, round, and reactive to light. Right eye exhibits no discharge. No scleral icterus.  Neck: Normal range of motion. Neck supple. No JVD present. Carotid bruit is not present. No thyromegaly present.  Cardiovascular: Normal rate, regular rhythm, normal heart sounds and intact distal pulses.  Exam reveals no gallop.   Pulmonary/Chest: Effort normal and breath sounds normal. No respiratory distress. She has no wheezes.  Abdominal: Soft. Bowel sounds are normal. She exhibits no distension, no abdominal bruit and no mass. There is no tenderness.  Genitourinary: Vagina normal and uterus normal. No breast swelling, tenderness, discharge or bleeding. There is no rash, tenderness or lesion on the right labia. There is no rash or tenderness on the left labia. Uterus is not enlarged and not tender. Cervix exhibits no motion  tenderness, no discharge and no friability. Right adnexum displays no mass, no tenderness and no fullness. Left adnexum displays no mass, no tenderness and no fullness. No erythema or bleeding around the vagina. No vaginal discharge found.  Musculoskeletal: She exhibits no edema and no tenderness.       R wrist - small ganglionic cyst- mobile and nt    Lymphadenopathy:    She has no cervical adenopathy.  Neurological: She is alert. She has normal reflexes. No cranial nerve deficit. She exhibits normal muscle tone. Coordination normal.  Skin: Skin is warm and dry. No rash  noted. No erythema. No pallor.  Psychiatric: She has a normal mood and affect.          Assessment & Plan:

## 2012-12-03 NOTE — Assessment & Plan Note (Signed)
On R wrist at base of thumb Less than 1 cm and not bothersome Will update if this grows or becomes painful

## 2012-12-03 NOTE — Assessment & Plan Note (Signed)
bp better on 2nd check bp in fair control at this time  No changes needed  Disc lifstyle change with low sodium diet and exercise   Lab today

## 2012-12-03 NOTE — Assessment & Plan Note (Signed)
Refilled generic cymbalta and that is working well

## 2012-12-05 ENCOUNTER — Telehealth: Payer: Self-pay | Admitting: Family Medicine

## 2012-12-05 DIAGNOSIS — R739 Hyperglycemia, unspecified: Secondary | ICD-10-CM | POA: Insufficient documentation

## 2012-12-05 NOTE — Telephone Encounter (Signed)
Future order for a1c

## 2012-12-09 ENCOUNTER — Encounter: Payer: Self-pay | Admitting: *Deleted

## 2012-12-28 ENCOUNTER — Ambulatory Visit: Payer: Self-pay | Admitting: Family Medicine

## 2012-12-29 ENCOUNTER — Encounter: Payer: Self-pay | Admitting: Family Medicine

## 2013-10-13 ENCOUNTER — Other Ambulatory Visit: Payer: Self-pay

## 2013-12-02 ENCOUNTER — Other Ambulatory Visit: Payer: Self-pay | Admitting: Family Medicine

## 2013-12-02 NOTE — Telephone Encounter (Signed)
Electronic refill request, please advise  

## 2013-12-02 NOTE — Telephone Encounter (Signed)
Please schedule a PE in the spring and refill until then, thanks

## 2013-12-06 NOTE — Telephone Encounter (Signed)
CPE schedule and med refilled

## 2013-12-12 ENCOUNTER — Other Ambulatory Visit: Payer: Self-pay | Admitting: *Deleted

## 2013-12-12 NOTE — Telephone Encounter (Signed)
Please refill until April appt, thanks

## 2013-12-12 NOTE — Telephone Encounter (Signed)
Ok to refill? Has not been seen in over 1 year. Next appt in April.

## 2013-12-13 MED ORDER — LEVONORGEST-ETH ESTRAD 91-DAY 0.15-0.03 MG PO TABS: 1.0000 | ORAL_TABLET | Freq: Every day | ORAL | Status: DC

## 2014-03-17 ENCOUNTER — Encounter: Payer: 59 | Admitting: Family Medicine

## 2014-03-27 ENCOUNTER — Encounter: Payer: 59 | Admitting: Family Medicine

## 2014-04-27 ENCOUNTER — Telehealth: Payer: Self-pay | Admitting: Family Medicine

## 2014-04-27 DIAGNOSIS — R739 Hyperglycemia, unspecified: Secondary | ICD-10-CM

## 2014-04-27 DIAGNOSIS — Z0000 Encounter for general adult medical examination without abnormal findings: Secondary | ICD-10-CM

## 2014-04-27 NOTE — Telephone Encounter (Signed)
Message copied by Judy PimpleWER, MARNE A on Thu Apr 27, 2014  3:50 PM ------      Message from: Alvina ChouWALSH, TERRI J      Created: Wed Apr 19, 2014  5:09 PM      Regarding: Lab orders for Friday, 5.22.15       Patient is scheduled for CPX labs, please order future labs, Thanks , Terri       ------

## 2014-04-28 ENCOUNTER — Other Ambulatory Visit (INDEPENDENT_AMBULATORY_CARE_PROVIDER_SITE_OTHER): Payer: 59

## 2014-04-28 DIAGNOSIS — Z0000 Encounter for general adult medical examination without abnormal findings: Secondary | ICD-10-CM

## 2014-04-28 DIAGNOSIS — R7309 Other abnormal glucose: Secondary | ICD-10-CM

## 2014-04-28 DIAGNOSIS — R739 Hyperglycemia, unspecified: Secondary | ICD-10-CM

## 2014-04-28 LAB — CBC WITH DIFFERENTIAL/PLATELET
BASOS PCT: 0.2 % (ref 0.0–3.0)
Basophils Absolute: 0 10*3/uL (ref 0.0–0.1)
EOS PCT: 4.3 % (ref 0.0–5.0)
Eosinophils Absolute: 0.5 10*3/uL (ref 0.0–0.7)
HCT: 38.4 % (ref 36.0–46.0)
HEMOGLOBIN: 12.9 g/dL (ref 12.0–15.0)
LYMPHS PCT: 25.1 % (ref 12.0–46.0)
Lymphs Abs: 2.9 10*3/uL (ref 0.7–4.0)
MCHC: 33.7 g/dL (ref 30.0–36.0)
MCV: 89 fl (ref 78.0–100.0)
MONOS PCT: 4.9 % (ref 3.0–12.0)
Monocytes Absolute: 0.6 10*3/uL (ref 0.1–1.0)
NEUTROS ABS: 7.6 10*3/uL (ref 1.4–7.7)
NEUTROS PCT: 65.5 % (ref 43.0–77.0)
Platelets: 428 10*3/uL — ABNORMAL HIGH (ref 150.0–400.0)
RBC: 4.31 Mil/uL (ref 3.87–5.11)
RDW: 13.2 % (ref 11.5–15.5)
WBC: 11.5 10*3/uL — AB (ref 4.0–10.5)

## 2014-04-28 LAB — COMPREHENSIVE METABOLIC PANEL
ALBUMIN: 3.6 g/dL (ref 3.5–5.2)
ALT: 16 U/L (ref 0–35)
AST: 17 U/L (ref 0–37)
Alkaline Phosphatase: 77 U/L (ref 39–117)
BUN: 10 mg/dL (ref 6–23)
CALCIUM: 9 mg/dL (ref 8.4–10.5)
CHLORIDE: 101 meq/L (ref 96–112)
CO2: 27 meq/L (ref 19–32)
CREATININE: 0.7 mg/dL (ref 0.4–1.2)
GFR: 101.2 mL/min (ref >60.00)
GLUCOSE: 113 mg/dL — AB (ref 70–99)
POTASSIUM: 3.8 meq/L (ref 3.5–5.1)
Sodium: 137 mEq/L (ref 135–145)
Total Bilirubin: 0.4 mg/dL (ref 0.2–1.2)
Total Protein: 7.5 g/dL (ref 6.0–8.3)

## 2014-04-28 LAB — TSH: TSH: 2.01 u[IU]/mL (ref 0.35–4.50)

## 2014-04-28 LAB — HEMOGLOBIN A1C: Hgb A1c MFr Bld: 6.2 % (ref 4.6–6.5)

## 2014-04-28 LAB — LIPID PANEL
CHOL/HDL RATIO: 4
Cholesterol: 170 mg/dL (ref 0–200)
HDL: 38 mg/dL — AB (ref >39.00)
LDL CALC: 107 mg/dL — AB (ref 0–99)
TRIGLYCERIDES: 124 mg/dL (ref 0.0–149.0)
VLDL: 24.8 mg/dL (ref 0.0–40.0)

## 2014-05-02 ENCOUNTER — Ambulatory Visit (INDEPENDENT_AMBULATORY_CARE_PROVIDER_SITE_OTHER): Payer: 59 | Admitting: Family Medicine

## 2014-05-02 ENCOUNTER — Encounter: Payer: Self-pay | Admitting: Family Medicine

## 2014-05-02 ENCOUNTER — Other Ambulatory Visit (HOSPITAL_COMMUNITY)
Admission: RE | Admit: 2014-05-02 | Discharge: 2014-05-02 | Disposition: A | Discharge status: Home / Self Care | Payer: 59 | Source: Ambulatory Visit | Attending: Family Medicine | Admitting: Family Medicine

## 2014-05-02 VITALS — BP 132/80 | HR 99 | Temp 98.9°F | Ht 66.25 in | Wt 279.8 lb

## 2014-05-02 DIAGNOSIS — E669 Obesity, unspecified: Secondary | ICD-10-CM

## 2014-05-02 DIAGNOSIS — Z01419 Encounter for gynecological examination (general) (routine) without abnormal findings: Secondary | ICD-10-CM

## 2014-05-02 DIAGNOSIS — R739 Hyperglycemia, unspecified: Secondary | ICD-10-CM

## 2014-05-02 DIAGNOSIS — D72829 Elevated white blood cell count, unspecified: Secondary | ICD-10-CM

## 2014-05-02 DIAGNOSIS — R7309 Other abnormal glucose: Secondary | ICD-10-CM

## 2014-05-02 DIAGNOSIS — Z1151 Encounter for screening for human papillomavirus (HPV): Secondary | ICD-10-CM | POA: Insufficient documentation

## 2014-05-02 DIAGNOSIS — E785 Hyperlipidemia, unspecified: Secondary | ICD-10-CM

## 2014-05-02 DIAGNOSIS — Z0000 Encounter for general adult medical examination without abnormal findings: Secondary | ICD-10-CM

## 2014-05-02 DIAGNOSIS — I10 Essential (primary) hypertension: Secondary | ICD-10-CM

## 2014-05-02 MED ORDER — VALSARTAN-HYDROCHLOROTHIAZIDE 80-12.5 MG PO TABS: ORAL_TABLET | ORAL | Status: DC

## 2014-05-02 MED ORDER — DULOXETINE HCL 60 MG PO CPEP: ORAL_CAPSULE | ORAL | Status: DC

## 2014-05-02 MED ORDER — ALBUTEROL SULFATE HFA 108 (90 BASE) MCG/ACT IN AERS: 2.0000 | INHALATION_SPRAY | RESPIRATORY_TRACT | Status: DC | PRN

## 2014-05-02 MED ORDER — LEVONORGEST-ETH ESTRAD 91-DAY 0.15-0.03 MG PO TABS: 1.0000 | ORAL_TABLET | Freq: Every day | ORAL | Status: DC

## 2014-05-02 NOTE — Patient Instructions (Signed)
You have hyperglycemia (pre diabetes)- low sugar diet/ exercise and weight loss will help this  Drink water and avoid juices and sugary beverages  Follow up in 6 months with labs prior

## 2014-05-02 NOTE — Assessment & Plan Note (Addendum)
bp in fair control at this time  BP Readings from Last 1 Encounters:  05/02/14 142/78  re check was 135/80 after sitting  No changes needed Disc lifstyle change with low sodium diet and exercise   Labs reviewed  Wt loss enc

## 2014-05-02 NOTE — Assessment & Plan Note (Signed)
Reviewed health habits including diet and exercise and skin cancer prevention Reviewed appropriate screening tests for age  Also reviewed health mt list, fam hx and immunization status , as well as social and family history   Labs reviewed  Pt has mammogram scheduled

## 2014-05-02 NOTE — Assessment & Plan Note (Signed)
Pt lacks motivation in general Discussed how this problem influences overall health and the risks it imposes  Reviewed plan for weight loss with lower calorie diet (via better food choices and also portion control or program like weight watchers) and exercise building up to or more than 30 minutes 5 days per week including some aerobic activity    She is thinking about making some changes now

## 2014-05-02 NOTE — Assessment & Plan Note (Signed)
Lab Results  Component Value Date   WBC 11.5* 04/28/2014   Will continue to watch-this tends to be her baseline Also mildly inc platelet ct F/u 6 mo
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
## 2014-05-02 NOTE — Assessment & Plan Note (Signed)
Annual exam with pap  Long phase OC refilled  No problems

## 2014-05-02 NOTE — Progress Notes (Signed)
Pre visit review using our clinic review tool, if applicable. No additional management support is needed unless otherwise documented below in the visit note. 

## 2014-05-02 NOTE — Assessment & Plan Note (Signed)
Disc goals for lipids and reasons to control them Rev labs with pt Rev low sat fat diet in detail   

## 2014-05-02 NOTE — Assessment & Plan Note (Signed)
Lab Results  Component Value Date   HGBA1C 6.2 04/28/2014   Disc prediabetes and strategy of low glycemic diet/ exercise and wt loss

## 2014-05-03 ENCOUNTER — Telehealth: Payer: Self-pay | Admitting: Family Medicine

## 2014-05-03 NOTE — Telephone Encounter (Signed)
Relevant patient education assigned to patient using Emmi. ° °

## 2014-05-08 ENCOUNTER — Ambulatory Visit: Payer: Self-pay | Admitting: Family Medicine

## 2014-05-09 ENCOUNTER — Encounter: Payer: Self-pay | Admitting: Family Medicine

## 2014-10-23 ENCOUNTER — Other Ambulatory Visit: Payer: 59

## 2014-10-27 ENCOUNTER — Ambulatory Visit: Payer: 59 | Admitting: Family Medicine

## 2015-03-14 ENCOUNTER — Telehealth: Payer: Self-pay

## 2015-03-14 MED ORDER — DICLOFENAC SODIUM 1 % TD GEL: 2.0000 g | Freq: Four times a day (QID) | TRANSDERMAL | Status: DC

## 2015-03-14 NOTE — Telephone Encounter (Signed)
I sent that  Do not use at the same time as oral anti inflammatories  Thanks

## 2015-03-14 NOTE — Telephone Encounter (Signed)
Patient notified, will not use the gel at the same time as any anti inflammatories.

## 2015-03-14 NOTE — Telephone Encounter (Signed)
Pt left v/m; previously pt had gotten voltaren gel 1% for rt foot chronic pain due to arthritis; pt request Dr Milinda Antisower to send in new rx for gel to [facility]. Dr Milinda Antisower has never prescribed and not on med list; pt last seen 05/02/14 and has CPX scheduled on 06/06/15.Please advise.

## 2015-05-23 ENCOUNTER — Other Ambulatory Visit: Payer: 59

## 2015-05-30 ENCOUNTER — Telehealth: Payer: Self-pay | Admitting: Family Medicine

## 2015-05-30 ENCOUNTER — Encounter: Payer: 59 | Admitting: Family Medicine

## 2015-05-30 DIAGNOSIS — R739 Hyperglycemia, unspecified: Secondary | ICD-10-CM

## 2015-05-30 DIAGNOSIS — Z0000 Encounter for general adult medical examination without abnormal findings: Secondary | ICD-10-CM

## 2015-05-30 NOTE — Telephone Encounter (Signed)
-----   Message from Alvina Chou sent at 05/23/2015 10:49 AM EDT ----- Regarding: Lab orders for Thursday, 6.23.16 Patient is scheduled for CPX labs, please order future labs, Thanks , Camelia Eng

## 2015-05-31 ENCOUNTER — Other Ambulatory Visit (INDEPENDENT_AMBULATORY_CARE_PROVIDER_SITE_OTHER): Payer: 59

## 2015-05-31 DIAGNOSIS — R739 Hyperglycemia, unspecified: Secondary | ICD-10-CM

## 2015-05-31 DIAGNOSIS — Z0000 Encounter for general adult medical examination without abnormal findings: Secondary | ICD-10-CM | POA: Diagnosis not present

## 2015-05-31 LAB — LIPID PANEL
CHOL/HDL RATIO: 4
CHOLESTEROL: 176 mg/dL (ref 0–200)
HDL: 42.4 mg/dL (ref >39.00)
LDL CALC: 112 mg/dL — AB (ref 0–99)
NonHDL: 133.6
Triglycerides: 106 mg/dL (ref 0.0–149.0)
VLDL: 21.2 mg/dL (ref 0.0–40.0)

## 2015-05-31 LAB — CBC WITH DIFFERENTIAL/PLATELET
Basophils Absolute: 0 10*3/uL (ref 0.0–0.1)
Basophils Relative: 0.4 % (ref 0.0–3.0)
EOS ABS: 0.5 10*3/uL (ref 0.0–0.7)
Eosinophils Relative: 4.3 % (ref 0.0–5.0)
HCT: 40.1 % (ref 36.0–46.0)
Hemoglobin: 13.4 g/dL (ref 12.0–15.0)
Lymphocytes Relative: 25.8 % (ref 12.0–46.0)
Lymphs Abs: 3 10*3/uL (ref 0.7–4.0)
MCHC: 33.4 g/dL (ref 30.0–36.0)
MCV: 88.2 fl (ref 78.0–100.0)
Monocytes Absolute: 0.6 10*3/uL (ref 0.1–1.0)
Monocytes Relative: 5 % (ref 3.0–12.0)
NEUTROS PCT: 64.5 % (ref 43.0–77.0)
Neutro Abs: 7.4 10*3/uL (ref 1.4–7.7)
Platelets: 447 10*3/uL — ABNORMAL HIGH (ref 150.0–400.0)
RBC: 4.54 Mil/uL (ref 3.87–5.11)
RDW: 13.1 % (ref 11.5–15.5)
WBC: 11.5 10*3/uL — AB (ref 4.0–10.5)

## 2015-05-31 LAB — COMPREHENSIVE METABOLIC PANEL
ALT: 16 U/L (ref 0–35)
AST: 15 U/L (ref 0–37)
Albumin: 3.9 g/dL (ref 3.5–5.2)
Alkaline Phosphatase: 84 U/L (ref 39–117)
BILIRUBIN TOTAL: 0.3 mg/dL (ref 0.2–1.2)
BUN: 11 mg/dL (ref 6–23)
CO2: 26 meq/L (ref 19–32)
CREATININE: 0.67 mg/dL (ref 0.40–1.20)
Calcium: 9 mg/dL (ref 8.4–10.5)
Chloride: 102 mEq/L (ref 96–112)
GFR: 100.71 mL/min (ref >60.00)
GLUCOSE: 113 mg/dL — AB (ref 70–99)
POTASSIUM: 3.9 meq/L (ref 3.5–5.1)
SODIUM: 135 meq/L (ref 135–145)
TOTAL PROTEIN: 7.5 g/dL (ref 6.0–8.3)

## 2015-05-31 LAB — TSH: TSH: 3.79 u[IU]/mL (ref 0.35–4.50)

## 2015-05-31 LAB — HEMOGLOBIN A1C: Hgb A1c MFr Bld: 6.2 % (ref 4.6–6.5)

## 2015-06-06 ENCOUNTER — Encounter: Payer: Self-pay | Admitting: Family Medicine

## 2015-06-06 ENCOUNTER — Ambulatory Visit (INDEPENDENT_AMBULATORY_CARE_PROVIDER_SITE_OTHER): Payer: 59 | Admitting: Family Medicine

## 2015-06-06 VITALS — BP 130/80 | HR 113 | Temp 99.2°F | Ht 66.25 in | Wt 280.0 lb

## 2015-06-06 DIAGNOSIS — Z0000 Encounter for general adult medical examination without abnormal findings: Secondary | ICD-10-CM | POA: Diagnosis not present

## 2015-06-06 DIAGNOSIS — E785 Hyperlipidemia, unspecified: Secondary | ICD-10-CM | POA: Diagnosis not present

## 2015-06-06 DIAGNOSIS — F4323 Adjustment disorder with mixed anxiety and depressed mood: Secondary | ICD-10-CM

## 2015-06-06 DIAGNOSIS — I10 Essential (primary) hypertension: Secondary | ICD-10-CM

## 2015-06-06 DIAGNOSIS — E669 Obesity, unspecified: Secondary | ICD-10-CM

## 2015-06-06 DIAGNOSIS — R739 Hyperglycemia, unspecified: Secondary | ICD-10-CM

## 2015-06-06 MED ORDER — VALSARTAN-HYDROCHLOROTHIAZIDE 80-12.5 MG PO TABS: ORAL_TABLET | ORAL | Status: DC

## 2015-06-06 MED ORDER — DULOXETINE HCL 60 MG PO CPEP: ORAL_CAPSULE | ORAL | Status: DC

## 2015-06-06 MED ORDER — LEVONORGEST-ETH ESTRAD 91-DAY 0.15-0.03 MG PO TABS: 1.0000 | ORAL_TABLET | Freq: Every day | ORAL | Status: DC
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
## 2015-06-06 NOTE — Assessment & Plan Note (Signed)
Discussed how this problem influences overall health and the risks it imposes  Reviewed plan for weight loss with lower calorie diet (via better food choices and also portion control or program like weight watchers) and exercise building up to or more than 30 minutes 5 days per week including some aerobic activity   Pt is not completely motivated re; depression and emotional eating  Urged her to consider counseling for this - will consider

## 2015-06-06 NOTE — Assessment & Plan Note (Signed)
Reviewed health habits including diet and exercise and skin cancer prevention Reviewed appropriate screening tests for age  Also reviewed health mt list, fam hx and immunization status , as well as social and family history   See HPI Labs reviewed Gyn exam not done this year Declines HIV test- low risk Will make her own mammogram appt  Enc flu shot in the fall Enc wt loss

## 2015-06-06 NOTE — Assessment & Plan Note (Signed)
Disc goals for lipids and reasons to control them Rev labs with pt Rev low sat fat diet in detail   

## 2015-06-06 NOTE — Assessment & Plan Note (Signed)
Reviewed stressors/ coping techniques/symptoms/ support sources/ tx options and side effects in detail today Fair control with current medication  Still some dep symptoms and overeating/emotional  Enc counseling -will consider

## 2015-06-06 NOTE — Assessment & Plan Note (Signed)
Lab Results  Component Value Date   HGBA1C 6.2 05/31/2015   This is stable Rev risks for DM Low glycemic diet and wt loss enc

## 2015-06-06 NOTE — Assessment & Plan Note (Signed)
bp in fair control at this time  BP Readings from Last 1 Encounters:  06/06/15 130/80   No changes needed Disc lifstyle change with low sodium diet and exercise  Labs reviewed  Enc wt loss

## 2015-06-06 NOTE — Progress Notes (Signed)
Pre visit review using our clinic review tool, if applicable. No additional management support is needed unless otherwise documented below in the visit note. 

## 2015-06-06 NOTE — Patient Instructions (Signed)
Please make your annual mammogram appt. Use the recumbent bike for exercise  Consider seeing a psychologist/counselor for depression - if you want a referral let us know  No change in medicines

## 2015-09-03 ENCOUNTER — Other Ambulatory Visit: Payer: Self-pay | Admitting: Family Medicine

## 2015-09-03 DIAGNOSIS — Z1231 Encounter for screening mammogram for malignant neoplasm of breast: Secondary | ICD-10-CM

## 2015-09-24 ENCOUNTER — Ambulatory Visit
Admission: RE | Admit: 2015-09-24 | Discharge: 2015-09-24 | Disposition: A | Discharge status: Home / Self Care | Payer: Commercial Managed Care - HMO | Source: Ambulatory Visit | Attending: Family Medicine | Admitting: Family Medicine

## 2015-09-24 DIAGNOSIS — Z1231 Encounter for screening mammogram for malignant neoplasm of breast: Secondary | ICD-10-CM | POA: Insufficient documentation

## 2015-10-15 ENCOUNTER — Encounter: Payer: Self-pay | Admitting: Gastroenterology

## 2016-03-18 ENCOUNTER — Telehealth: Payer: Self-pay | Admitting: Family Medicine

## 2016-03-18 NOTE — Telephone Encounter (Signed)
Pease schedule a fall PE and refill until then

## 2016-03-18 NOTE — Telephone Encounter (Signed)
Last OV was CPE on 06/06/15 and no future appts scheduled please advise

## 2016-03-19 NOTE — Telephone Encounter (Signed)
Left voicemail requesting pt to call office back 

## 2016-03-25 NOTE — Telephone Encounter (Signed)
Left 2nd voicemail requesting pt to call office back 

## 2016-03-26 MED ORDER — DULOXETINE HCL 60 MG PO CPEP: ORAL_CAPSULE | ORAL | Status: DC

## 2016-03-26 MED ORDER — LEVONORGEST-ETH ESTRAD 91-DAY 0.15-0.03 MG PO TABS: 1.0000 | ORAL_TABLET | Freq: Every day | ORAL | Status: DC

## 2016-03-26 MED ORDER — VALSARTAN-HYDROCHLOROTHIAZIDE 80-12.5 MG PO TABS: ORAL_TABLET | ORAL | Status: DC

## 2016-03-26 NOTE — Addendum Note (Signed)
Addended by: Shon MilletWATLINGTON, Mariselda Badalamenti M on: 03/26/2016 02:38 PM   Modules accepted: Orders

## 2016-03-26 NOTE — Telephone Encounter (Signed)
Patient returned Shapale's call.  Please call her back at [phone].

## 2016-03-26 NOTE — Telephone Encounter (Signed)
CPE scheduled and meds refilled  

## 2016-06-17 ENCOUNTER — Ambulatory Visit (INDEPENDENT_AMBULATORY_CARE_PROVIDER_SITE_OTHER): Payer: 59 | Admitting: Family Medicine

## 2016-06-17 ENCOUNTER — Encounter: Payer: Self-pay | Admitting: Family Medicine

## 2016-06-17 VITALS — BP 130/70 | HR 100 | Temp 99.2°F | Ht 66.25 in | Wt 290.5 lb

## 2016-06-17 DIAGNOSIS — R739 Hyperglycemia, unspecified: Secondary | ICD-10-CM | POA: Diagnosis not present

## 2016-06-17 DIAGNOSIS — F4323 Adjustment disorder with mixed anxiety and depressed mood: Secondary | ICD-10-CM

## 2016-06-17 DIAGNOSIS — E785 Hyperlipidemia, unspecified: Secondary | ICD-10-CM

## 2016-06-17 DIAGNOSIS — Z0000 Encounter for general adult medical examination without abnormal findings: Secondary | ICD-10-CM | POA: Diagnosis not present

## 2016-06-17 DIAGNOSIS — I10 Essential (primary) hypertension: Secondary | ICD-10-CM

## 2016-06-17 MED ORDER — DULOXETINE HCL 60 MG PO CPEP: ORAL_CAPSULE | ORAL | Status: DC

## 2016-06-17 MED ORDER — RANITIDINE HCL 150 MG PO CAPS: 150.0000 mg | ORAL_CAPSULE | Freq: Two times a day (BID) | ORAL | Status: DC

## 2016-06-17 MED ORDER — LEVONORGEST-ETH ESTRAD 91-DAY 0.15-0.03 MG PO TABS: 1.0000 | ORAL_TABLET | Freq: Every day | ORAL | Status: DC

## 2016-06-17 MED ORDER — VALSARTAN-HYDROCHLOROTHIAZIDE 80-12.5 MG PO TABS: ORAL_TABLET | ORAL | Status: DC

## 2016-06-17 NOTE — Assessment & Plan Note (Signed)
Was due for lipids today but pt left abruptly and labs were not done Pt states she will find a new doctor
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
## 2016-06-17 NOTE — Patient Instructions (Addendum)
  Take ranitidine 150 mg over the counter twice daily - if that does not eliminate symptoms let me know  Try to work on weight loss - do your best  any little exercise helps  Good luck getting started with another physician

## 2016-06-17 NOTE — Progress Notes (Signed)
Pre visit review using our clinic review tool, if applicable. No additional management support is needed unless otherwise documented below in the visit note. 

## 2016-06-17 NOTE — Assessment & Plan Note (Signed)
Was due for A1C today but she left abruptly so labs were not done

## 2016-06-17 NOTE — Assessment & Plan Note (Addendum)
Long disc re: stressors and worsened symptoms of anxiety Reviewed stressors/ coping techniques/symptoms/ support sources/ tx options and side effects in detail today She is on SNRI Made ref for counseling- and then cancelled it when she left abruptly and stated she wanted it cancelled She asked for xanax- I prefer to wait until after her psychology visit to discuss further and she announced that she was going to find a new doctor - left abruptly unfortunately  She also became upset and raised her voice (no violence however) She stated that I "did not listen to her" and "do not care about her" and plans to find another doctor

## 2016-06-17 NOTE — Assessment & Plan Note (Signed)
Discussed how this problem influences overall health and the risks it imposes  Reviewed plan for weight loss with lower calorie diet (via better food choices and also portion control or program like weight watchers) and exercise building up to or more than 30 minutes 5 days per week including some aerobic activity    Pt states walking is her pref exercise - at least for short amt of time This was encouraged  Pt left abruptly at end of visit stating she was going to find a new doctor

## 2016-06-17 NOTE — Assessment & Plan Note (Signed)
Reviewed health habits including diet and exercise and skin cancer prevention Reviewed appropriate screening tests for age  Also reviewed health mt list, fam hx and immunization status , as well as social and family history    Labs were planned but not done when pt left abruptly and stated she was going to find a new doctor

## 2016-06-17 NOTE — Assessment & Plan Note (Signed)
Better bp on 2nd check  Will continue to monitor Labs planned but pt left visit abruptly so they were cancelled  (pt stated she was going to find a new doctor)

## 2016-09-09 ENCOUNTER — Ambulatory Visit: Payer: Self-pay | Admitting: Gastroenterology

## 2017-04-06 ENCOUNTER — Other Ambulatory Visit: Payer: Self-pay | Admitting: Family Medicine

## 2017-04-06 NOTE — Telephone Encounter (Signed)
Ok to refill to get by untl 8/1 Schedule f/u in august please

## 2017-04-06 NOTE — Telephone Encounter (Signed)
Received refill electronically Last refill 06/07/16 #90/3 Last office visit 06/17/16 See allergy/contraindication

## 2017-04-08 NOTE — Telephone Encounter (Signed)
Left voicemail requesting pt to call the office back 

## 2017-04-16 ENCOUNTER — Encounter: Payer: Self-pay | Admitting: *Deleted

## 2017-04-16 NOTE — Telephone Encounter (Signed)
Letter mailed requesting pt to call the office and schedule an appt

## 2017-06-23 ENCOUNTER — Other Ambulatory Visit: Payer: Self-pay | Admitting: Family Medicine

## 2017-06-23 DIAGNOSIS — Z1231 Encounter for screening mammogram for malignant neoplasm of breast: Secondary | ICD-10-CM

## 2017-07-08 ENCOUNTER — Ambulatory Visit
Admission: RE | Admit: 2017-07-08 | Discharge: 2017-07-08 | Disposition: A | Discharge status: Home / Self Care | Payer: 59 | Source: Ambulatory Visit | Attending: Family Medicine | Admitting: Family Medicine

## 2017-07-08 DIAGNOSIS — Z1231 Encounter for screening mammogram for malignant neoplasm of breast: Secondary | ICD-10-CM | POA: Diagnosis present

## 2017-07-16 ENCOUNTER — Other Ambulatory Visit: Payer: Self-pay | Admitting: Family Medicine

## 2017-07-16 DIAGNOSIS — E0590 Thyrotoxicosis, unspecified without thyrotoxic crisis or storm: Secondary | ICD-10-CM

## 2017-07-30 ENCOUNTER — Encounter
Admission: RE | Admit: 2017-07-30 | Discharge: 2017-07-30 | Disposition: A | Discharge status: Home / Self Care | Payer: 59 | Source: Ambulatory Visit | Attending: Family Medicine | Admitting: Family Medicine

## 2017-07-30 DIAGNOSIS — E0590 Thyrotoxicosis, unspecified without thyrotoxic crisis or storm: Secondary | ICD-10-CM | POA: Insufficient documentation

## 2017-07-30 MED ORDER — SODIUM IODIDE I-123 7.4 MBQ CAPS
144.4000 | ORAL_CAPSULE | Freq: Once | ORAL | Status: AC
  Administered 2017-07-30: 144.4 via ORAL

## 2017-07-31 ENCOUNTER — Encounter
Admission: RE | Admit: 2017-07-31 | Discharge: 2017-07-31 | Disposition: A | Discharge status: Home / Self Care | Payer: 59 | Source: Ambulatory Visit | Attending: Family Medicine | Admitting: Family Medicine

## 2017-08-04 ENCOUNTER — Telehealth: Payer: Self-pay

## 2017-08-04 NOTE — Telephone Encounter (Signed)
Called patient to see if she could come today for an appointment. Patient was unable to come today due to a work meeting, but I advised that we would keep her in mind.  Patient had no other questions.

## 2017-08-16 ENCOUNTER — Other Ambulatory Visit: Payer: Self-pay | Admitting: Family Medicine

## 2017-08-17 NOTE — Telephone Encounter (Signed)
We sent a letter out in May requesting pt to call the office and schedule f/u because we haven't seen her recently, pt never responded to our calls or the letter and she never scheduled appt., Rx declined and pharmacy notified pt needs to schedule appt

## 2017-09-01 ENCOUNTER — Ambulatory Visit: Payer: 59 | Admitting: Endocrinology

## 2017-09-09 ENCOUNTER — Ambulatory Visit (INDEPENDENT_AMBULATORY_CARE_PROVIDER_SITE_OTHER): Payer: 59 | Admitting: Internal Medicine

## 2017-09-09 ENCOUNTER — Encounter: Payer: Self-pay | Admitting: Internal Medicine

## 2017-09-09 ENCOUNTER — Other Ambulatory Visit: Payer: Self-pay | Admitting: Internal Medicine

## 2017-09-09 DIAGNOSIS — E0500 Thyrotoxicosis with diffuse goiter without thyrotoxic crisis or storm: Secondary | ICD-10-CM

## 2017-09-09 LAB — T3, FREE: T3, Free: 4.2 pg/mL (ref 2.3–4.2)

## 2017-09-09 LAB — T4, FREE: Free T4: 0.91 ng/dL (ref 0.60–1.60)

## 2017-09-09 LAB — TSH: TSH: <0.01 u[IU]/mL — ABNORMAL LOW (ref 0.35–4.50)
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
## 2017-09-09 NOTE — Patient Instructions (Addendum)
The tests indicate you have Graves disease.  Please change Methimazole to 10 mg in am and 5 mg with dinner.  Move Protonix with lunch.  Continue Toprol XL 50 mg daily.  Please stop at the lab.  Please come back for a follow-up appointment in 3 months.   Hyperthyroidism Hyperthyroidism is when the thyroid is too active (overactive). Your thyroid is a large gland that is located in your neck. The thyroid helps to control how your body uses food (metabolism). When your thyroid is overactive, it produces too much of a hormone called thyroxine. What are the causes? Causes of hyperthyroidism may include:  Graves disease. This is when your immune system attacks the thyroid gland. This is the most common cause.  Inflammation of the thyroid gland.  Tumor in the thyroid gland or somewhere else.  Excessive use of thyroid medicines, including: ? Prescription thyroid supplement. ? Herbal supplements that mimic thyroid hormones.  Solid or fluid-filled lumps within your thyroid gland (thyroid nodules).  Excessive ingestion of iodine.  What increases the risk?  Being female.  Having a family history of thyroid conditions. What are the signs or symptoms? Signs and symptoms of hyperthyroidism may include:  Nervousness.  Inability to tolerate heat.  Unexplained weight loss.  Diarrhea.  Change in the texture of hair or skin.  Heart skipping beats or making extra beats.  Rapid heart rate.  Loss of menstruation.  Shaky hands.  Fatigue.  Restlessness.  Increased appetite.  Sleep problems.  Enlarged thyroid gland or nodules.  How is this diagnosed? Diagnosis of hyperthyroidism may include:  Medical history and physical exam.  Blood tests.  Ultrasound tests.  How is this treated? Treatment may include:  Medicines to control your thyroid.  Surgery to remove your thyroid.  Radiation therapy.  Follow these instructions at home:  Take medicines only as  directed by your health care provider.  Do not use any tobacco products, including cigarettes, chewing tobacco, or electronic cigarettes. If you need help quitting, ask your health care provider.  Do not exercise or do physical activity until your health care provider approves.  Keep all follow-up appointments as directed by your health care provider. This is important. Contact a health care provider if:  Your symptoms do not get better with treatment.  You have fever.  You are taking thyroid replacement medicine and you: ? Have depression. ? Feel mentally and physically slow. ? Have weight gain. Get help right away if:  You have decreased alertness or a change in your awareness.  You have abdominal pain.  You feel dizzy.  You have a rapid heartbeat.  You have an irregular heartbeat. This information is not intended to replace advice given to you by your health care provider. Make sure you discuss any questions you have with your health care provider. Document Released: 11/24/2005 Document Revised: 04/24/2016 Document Reviewed: 04/11/2014 Elsevier Interactive Patient Education  2017 [employer].

## 2017-09-09 NOTE — Progress Notes (Signed)
Hypo   

## 2017-09-13 LAB — THYROID STIMULATING IMMUNOGLOBULIN: TSI: 567 % baseline — ABNORMAL HIGH (ref <140)

## 2017-09-18 ENCOUNTER — Telehealth: Payer: Self-pay | Admitting: Internal Medicine

## 2017-09-18 NOTE — Telephone Encounter (Signed)
Ok to fill 

## 2017-09-18 NOTE — Telephone Encounter (Signed)
Patient need test strips and lancets send to [facility]

## 2017-09-22 ENCOUNTER — Other Ambulatory Visit: Payer: Self-pay

## 2017-09-22 ENCOUNTER — Telehealth: Payer: Self-pay

## 2017-09-22 MED ORDER — ONETOUCH DELICA LANCETS FINE MISC: 5 refills | Status: DC

## 2017-09-22 MED ORDER — GLUCOSE BLOOD VI STRP: ORAL_STRIP | 12 refills | Status: DC

## 2017-09-22 NOTE — Telephone Encounter (Signed)
Submitted. Thanks

## 2017-09-22 NOTE — Telephone Encounter (Signed)
Called and LVM advising patient to call back to discuss which brand to send in for her test strips and lancets. Left call back number.   

## 2017-09-22 NOTE — Telephone Encounter (Signed)
Please advise, patient is seen for Graves? And does not have any test strips or lancets on file. Okay to send?

## 2017-09-22 NOTE — Telephone Encounter (Signed)
Patient stated she need tne touch verio flex test strips.

## 2017-09-22 NOTE — Telephone Encounter (Signed)
OK to send, but we did not address her DM at last visit >> we can do so at the next visit, until then, she needs to contact PCP for DM-related issues.

## 2017-09-22 NOTE — Telephone Encounter (Signed)
Called and LVM advising patient to call back to discuss which brand to send in for her test strips and lancets. Left call back number.

## 2017-09-27 ENCOUNTER — Other Ambulatory Visit: Payer: Self-pay | Admitting: Family Medicine

## 2017-09-28 NOTE — Telephone Encounter (Signed)
In May we tried to get pt to call office to schedule a f/u appt, pt hasn't been seen in over a year and no future appts. Scheduled, we called pt multiple times and also sent pt a letter requesting pt to schedule CPE, pt never scheduled appt., Rx declined until pt contacts us to make f/u appt

## 2017-10-22 ENCOUNTER — Other Ambulatory Visit (INDEPENDENT_AMBULATORY_CARE_PROVIDER_SITE_OTHER): Payer: 59

## 2017-10-22 DIAGNOSIS — E0500 Thyrotoxicosis with diffuse goiter without thyrotoxic crisis or storm: Secondary | ICD-10-CM

## 2017-10-22 LAB — TSH: TSH: <0.01 u[IU]/mL — ABNORMAL LOW (ref 0.35–4.50)

## 2017-10-22 LAB — T4, FREE: FREE T4: 0.76 ng/dL (ref 0.60–1.60)

## 2017-10-22 LAB — T3, FREE: T3, Free: 3.5 pg/mL (ref 2.3–4.2)

## 2017-12-11 ENCOUNTER — Ambulatory Visit: Payer: 59 | Admitting: Internal Medicine

## 2017-12-11 ENCOUNTER — Encounter: Payer: Self-pay | Admitting: Internal Medicine

## 2017-12-11 VITALS — BP 148/82 | HR 109 | Ht 66.75 in | Wt 284.0 lb

## 2017-12-11 DIAGNOSIS — E0500 Thyrotoxicosis with diffuse goiter without thyrotoxic crisis or storm: Secondary | ICD-10-CM

## 2017-12-11 DIAGNOSIS — E1165 Type 2 diabetes mellitus with hyperglycemia: Secondary | ICD-10-CM | POA: Diagnosis not present

## 2017-12-11 DIAGNOSIS — Z794 Long term (current) use of insulin: Secondary | ICD-10-CM | POA: Diagnosis not present

## 2017-12-11 LAB — TSH: TSH: 0.06 u[IU]/mL — ABNORMAL LOW (ref 0.35–4.50)

## 2017-12-11 LAB — POCT GLYCOSYLATED HEMOGLOBIN (HGB A1C): Hemoglobin A1C: 7.5

## 2017-12-11 LAB — T4, FREE: FREE T4: 0.74 ng/dL (ref 0.60–1.60)

## 2017-12-11 LAB — HM DIABETES EYE EXAM

## 2017-12-11 LAB — T3, FREE: T3 FREE: 3.7 pg/mL (ref 2.3–4.2)

## 2017-12-11 MED ORDER — METFORMIN HCL ER 500 MG PO TB24: ORAL_TABLET | ORAL | 3 refills | Status: DC

## 2017-12-11 NOTE — Patient Instructions (Addendum)
Please stop at the lab.  Please continue Methimazole 10 mg in am and 5 mg in pm.  Also, continue Toprol XL 50 mg daily.  Please increase Metformin ER to 1000 mg 2x a day.  Please consider the following ways to cut down carbs and fat and increase fiber and micronutrients in your diet: - substitute whole grain for white bread or pasta - substitute brown rice for white rice - substitute 90-calorie flat bread pieces for slices of bread when possible - substitute sweet potatoes or yams for white potatoes - substitute humus for margarine - substitute tofu for cheese when possible - substitute almond or rice milk for regular milk (would not drink soy milk daily due to concern for soy estrogen influence on breast cancer risk) - substitute dark chocolate for other sweets when possible - substitute water - can add lemon or orange slices for taste - for diet sodas (artificial sweeteners will trick your body that you can eat sweets without getting calories and will lead you to overeating and weight gain in the long run) - do not skip breakfast or other meals (this will slow down the metabolism and will result in more weight gain over time)  - can try smoothies made from fruit and almond/rice milk in am instead of regular breakfast - can also try old-fashioned (not instant) oatmeal made with almond/rice milk in am - order the dressing on the side when eating salad at a restaurant (pour less than half of the dressing on the salad) - eat as little meat as possible - can try juicing, but should not forget that juicing will get rid of the fiber, so would alternate with eating raw veg./fruits or drinking smoothies - use as little oil as possible, even when using olive oil - can dress a salad with a mix of balsamic vinegar and lemon juice, for e.g. - use agave nectar, stevia sugar, or regular sugar rather than artificial sweateners - steam or broil/roast veggies  - snack on veggies/fruit/nuts (unsalted,  preferably) when possible, rather than processed foods - reduce or eliminate aspartame in diet (it is in diet sodas, chewing gum, etc) Read the labels!  Try to read Dr. Katherina RightNeal Barnard's book: "Program for Reversing Diabetes" for other ideas for healthy eating. [employer] diet   [employer] 7-day Rescue diet
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
## 2018-02-23 ENCOUNTER — Other Ambulatory Visit: Payer: Self-pay | Admitting: Internal Medicine

## 2018-03-07 DIAGNOSIS — R0602 Shortness of breath: Secondary | ICD-10-CM | POA: Diagnosis not present

## 2018-03-07 DIAGNOSIS — R05 Cough: Secondary | ICD-10-CM | POA: Diagnosis not present

## 2018-03-07 DIAGNOSIS — J181 Lobar pneumonia, unspecified organism: Secondary | ICD-10-CM | POA: Diagnosis not present

## 2018-03-11 ENCOUNTER — Ambulatory Visit: Payer: 59 | Admitting: Internal Medicine

## 2018-03-11 ENCOUNTER — Other Ambulatory Visit (INDEPENDENT_AMBULATORY_CARE_PROVIDER_SITE_OTHER): Payer: 59

## 2018-03-11 ENCOUNTER — Encounter: Payer: Self-pay | Admitting: Internal Medicine

## 2018-03-11 VITALS — BP 112/76 | HR 118 | Ht 66.75 in | Wt 275.2 lb

## 2018-03-11 DIAGNOSIS — J181 Lobar pneumonia, unspecified organism: Secondary | ICD-10-CM

## 2018-03-11 DIAGNOSIS — E0500 Thyrotoxicosis with diffuse goiter without thyrotoxic crisis or storm: Secondary | ICD-10-CM

## 2018-03-11 DIAGNOSIS — E1165 Type 2 diabetes mellitus with hyperglycemia: Secondary | ICD-10-CM | POA: Diagnosis not present

## 2018-03-11 DIAGNOSIS — Z794 Long term (current) use of insulin: Secondary | ICD-10-CM | POA: Diagnosis not present

## 2018-03-11 DIAGNOSIS — J189 Pneumonia, unspecified organism: Secondary | ICD-10-CM

## 2018-03-11 LAB — CBC WITH DIFFERENTIAL/PLATELET
Basophils Absolute: 0.2 10*3/uL — ABNORMAL HIGH (ref 0.0–0.1)
Basophils Relative: 0.7 % (ref 0.0–3.0)
EOS ABS: 0.1 10*3/uL (ref 0.0–0.7)
Eosinophils Relative: 0.3 % (ref 0.0–5.0)
HCT: 44.6 % (ref 36.0–46.0)
HEMOGLOBIN: 14.7 g/dL (ref 12.0–15.0)
LYMPHS ABS: 4.1 10*3/uL — AB (ref 0.7–4.0)
Lymphocytes Relative: 16.5 % (ref 12.0–46.0)
MCHC: 33 g/dL (ref 30.0–36.0)
MCV: 89.2 fl (ref 78.0–100.0)
MONO ABS: 1.5 10*3/uL — AB (ref 0.1–1.0)
Monocytes Relative: 5.9 % (ref 3.0–12.0)
NEUTROS PCT: 76.6 % (ref 43.0–77.0)
Neutro Abs: 19.1 10*3/uL — ABNORMAL HIGH (ref 1.4–7.7)
Platelets: 510 10*3/uL — ABNORMAL HIGH (ref 150.0–400.0)
RBC: 4.99 Mil/uL (ref 3.87–5.11)
RDW: 13.4 % (ref 11.5–15.5)

## 2018-03-11 LAB — POCT GLYCOSYLATED HEMOGLOBIN (HGB A1C): HEMOGLOBIN A1C: 6.8

## 2018-03-11 MED ORDER — METFORMIN HCL ER 500 MG PO TB24: ORAL_TABLET | ORAL | 2 refills | Status: DC
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
## 2018-03-11 NOTE — Patient Instructions (Addendum)
Please increase Metformin ER to 500 mg in am and 1000 mg with dinner.  Please continue Methimazole 10 mg 2x a day.  Come back for labs in 1 week after you stop Prednisone.   [facility].

## 2018-03-24 ENCOUNTER — Other Ambulatory Visit: Payer: Self-pay | Admitting: Internal Medicine

## 2018-03-24 ENCOUNTER — Other Ambulatory Visit (INDEPENDENT_AMBULATORY_CARE_PROVIDER_SITE_OTHER): Payer: 59

## 2018-03-24 DIAGNOSIS — E0500 Thyrotoxicosis with diffuse goiter without thyrotoxic crisis or storm: Secondary | ICD-10-CM

## 2018-03-24 LAB — T4, FREE: Free T4: 0.6 ng/dL (ref 0.60–1.60)

## 2018-03-24 LAB — TSH: TSH: 8.2 u[IU]/mL — ABNORMAL HIGH (ref 0.35–4.50)

## 2018-03-24 LAB — T3, FREE: T3 FREE: 3.7 pg/mL (ref 2.3–4.2)

## 2018-04-03 DIAGNOSIS — J209 Acute bronchitis, unspecified: Secondary | ICD-10-CM | POA: Diagnosis not present

## 2018-05-06 ENCOUNTER — Other Ambulatory Visit (INDEPENDENT_AMBULATORY_CARE_PROVIDER_SITE_OTHER): Payer: 59

## 2018-05-06 ENCOUNTER — Other Ambulatory Visit: Payer: Self-pay | Admitting: Internal Medicine

## 2018-05-06 DIAGNOSIS — E0500 Thyrotoxicosis with diffuse goiter without thyrotoxic crisis or storm: Secondary | ICD-10-CM

## 2018-05-06 LAB — T4, FREE: Free T4: 0.72 ng/dL (ref 0.60–1.60)

## 2018-05-06 LAB — T3, FREE: T3, Free: 4 pg/mL (ref 2.3–4.2)

## 2018-05-06 LAB — TSH: TSH: 4.35 u[IU]/mL (ref 0.35–4.50)

## 2018-05-07 ENCOUNTER — Other Ambulatory Visit: Payer: 59

## 2018-07-09 ENCOUNTER — Telehealth: Payer: Self-pay | Admitting: Internal Medicine

## 2018-07-09 MED ORDER — METFORMIN HCL ER 500 MG PO TB24: ORAL_TABLET | ORAL | 2 refills | Status: DC

## 2018-07-09 NOTE — Telephone Encounter (Signed)
[facility] [place] [id number]    Patient stated that [facility] would be faxing over a refill request for patients medication.   metFORMIN (GLUCOPHAGE-XR) 500 MG 24 hr tablet

## 2018-07-09 NOTE — Telephone Encounter (Signed)
Sent electronically 

## 2018-08-05 ENCOUNTER — Encounter: Payer: Self-pay | Admitting: Internal Medicine

## 2018-08-05 ENCOUNTER — Ambulatory Visit: Payer: 59 | Admitting: Internal Medicine

## 2018-08-05 VITALS — BP 158/84 | HR 113 | Ht 66.75 in | Wt 269.4 lb

## 2018-08-05 DIAGNOSIS — E0500 Thyrotoxicosis with diffuse goiter without thyrotoxic crisis or storm: Secondary | ICD-10-CM | POA: Diagnosis not present

## 2018-08-05 DIAGNOSIS — E1165 Type 2 diabetes mellitus with hyperglycemia: Secondary | ICD-10-CM

## 2018-08-05 DIAGNOSIS — Z794 Long term (current) use of insulin: Secondary | ICD-10-CM

## 2018-08-05 DIAGNOSIS — R0602 Shortness of breath: Secondary | ICD-10-CM | POA: Diagnosis not present

## 2018-08-05 LAB — POCT GLYCOSYLATED HEMOGLOBIN (HGB A1C): Hemoglobin A1C: 6.4 % — AB (ref 4.0–5.6)

## 2018-08-05 LAB — TSH: TSH: 1.45 u[IU]/mL (ref 0.35–4.50)

## 2018-08-05 LAB — T3, FREE: T3, Free: 4.3 pg/mL — ABNORMAL HIGH (ref 2.3–4.2)

## 2018-08-05 LAB — T4, FREE: Free T4: 0.75 ng/dL (ref 0.60–1.60)

## 2018-08-05 MED ORDER — METFORMIN HCL ER 500 MG PO TB24: ORAL_TABLET | ORAL | 2 refills | Status: DC

## 2018-08-05 MED ORDER — METOPROLOL SUCCINATE ER 50 MG PO TB24: 50.0000 mg | ORAL_TABLET | Freq: Every day | ORAL | 3 refills | Status: DC
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
## 2018-08-05 NOTE — Patient Instructions (Addendum)
Please continue: - Metformin ER 500 mg in a.m. and 500 mg with dinner  Also continue:  - Methimazole 5 mg daily  Please stop at the lab.  Please come back for a follow-up appointment in 4 months.

## 2018-08-05 NOTE — Addendum Note (Signed)
Addended by: Yolande JollyLAWSON, Kealy Lewter on: 08/05/2018 12:56 PM   Modules accepted: Orders

## 2018-08-06 ENCOUNTER — Other Ambulatory Visit: Payer: Self-pay | Admitting: Internal Medicine

## 2018-08-11 LAB — THYROID STIMULATING IMMUNOGLOBULIN: TSI: 449 %{baseline} — AB (ref <140)

## 2018-09-08 DIAGNOSIS — I10 Essential (primary) hypertension: Secondary | ICD-10-CM | POA: Diagnosis not present

## 2018-09-08 DIAGNOSIS — Z23 Encounter for immunization: Secondary | ICD-10-CM | POA: Diagnosis not present

## 2018-09-08 DIAGNOSIS — E119 Type 2 diabetes mellitus without complications: Secondary | ICD-10-CM | POA: Diagnosis not present

## 2018-09-30 ENCOUNTER — Other Ambulatory Visit: Payer: Self-pay | Admitting: Internal Medicine

## 2018-11-23 DIAGNOSIS — R000 Tachycardia, unspecified: Secondary | ICD-10-CM | POA: Diagnosis not present

## 2018-11-23 DIAGNOSIS — I10 Essential (primary) hypertension: Secondary | ICD-10-CM | POA: Diagnosis not present

## 2018-11-23 DIAGNOSIS — R0602 Shortness of breath: Secondary | ICD-10-CM | POA: Diagnosis not present

## 2018-12-06 ENCOUNTER — Encounter: Payer: Self-pay | Admitting: Internal Medicine

## 2018-12-06 ENCOUNTER — Ambulatory Visit: Payer: 59 | Admitting: Internal Medicine

## 2018-12-06 VITALS — BP 140/60 | HR 98 | Ht 66.75 in | Wt 263.0 lb

## 2018-12-06 DIAGNOSIS — E1165 Type 2 diabetes mellitus with hyperglycemia: Secondary | ICD-10-CM

## 2018-12-06 DIAGNOSIS — Z794 Long term (current) use of insulin: Secondary | ICD-10-CM

## 2018-12-06 DIAGNOSIS — E0500 Thyrotoxicosis with diffuse goiter without thyrotoxic crisis or storm: Secondary | ICD-10-CM | POA: Diagnosis not present

## 2018-12-06 LAB — POCT GLYCOSYLATED HEMOGLOBIN (HGB A1C): Hemoglobin A1C: 6.7 % — AB (ref 4.0–5.6)

## 2018-12-06 MED ORDER — METHIMAZOLE 5 MG PO TABS: 5.0000 mg | ORAL_TABLET | Freq: Every day | ORAL | 3 refills | Status: DC

## 2018-12-06 NOTE — Patient Instructions (Signed)
Please continue: - Metformin ER 500 mg twice a day  Also continue:  - Methimazole 5 mg daily  Please stop at the lab.  Please come back for a follow-up appointment in 4 months.
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
## 2018-12-07 ENCOUNTER — Other Ambulatory Visit (INDEPENDENT_AMBULATORY_CARE_PROVIDER_SITE_OTHER): Payer: 59

## 2018-12-07 DIAGNOSIS — E0500 Thyrotoxicosis with diffuse goiter without thyrotoxic crisis or storm: Secondary | ICD-10-CM

## 2018-12-07 LAB — TSH: TSH: 0.69 u[IU]/mL (ref 0.35–4.50)

## 2018-12-07 LAB — T3, FREE: T3, Free: 3.8 pg/mL (ref 2.3–4.2)

## 2018-12-07 LAB — T4, FREE: Free T4: 0.86 ng/dL (ref 0.60–1.60)

## 2018-12-27 DIAGNOSIS — R0602 Shortness of breath: Secondary | ICD-10-CM | POA: Diagnosis not present

## 2019-01-14 DIAGNOSIS — Z0000 Encounter for general adult medical examination without abnormal findings: Secondary | ICD-10-CM | POA: Diagnosis not present

## 2019-01-19 DIAGNOSIS — I10 Essential (primary) hypertension: Secondary | ICD-10-CM | POA: Diagnosis not present

## 2019-01-19 DIAGNOSIS — Z0000 Encounter for general adult medical examination without abnormal findings: Secondary | ICD-10-CM | POA: Diagnosis not present

## 2019-01-19 DIAGNOSIS — E0590 Thyrotoxicosis, unspecified without thyrotoxic crisis or storm: Secondary | ICD-10-CM | POA: Diagnosis not present

## 2019-01-21 ENCOUNTER — Other Ambulatory Visit: Payer: Self-pay | Admitting: Family Medicine

## 2019-01-21 DIAGNOSIS — Z1231 Encounter for screening mammogram for malignant neoplasm of breast: Secondary | ICD-10-CM

## 2019-02-02 ENCOUNTER — Ambulatory Visit
Admission: RE | Admit: 2019-02-02 | Discharge: 2019-02-02 | Disposition: A | Discharge status: Home / Self Care | Payer: 59 | Source: Ambulatory Visit | Attending: Family Medicine | Admitting: Family Medicine

## 2019-02-02 DIAGNOSIS — Z1231 Encounter for screening mammogram for malignant neoplasm of breast: Secondary | ICD-10-CM | POA: Diagnosis not present

## 2019-03-19 ENCOUNTER — Other Ambulatory Visit: Payer: Self-pay | Admitting: Internal Medicine

## 2019-04-07 ENCOUNTER — Ambulatory Visit (INDEPENDENT_AMBULATORY_CARE_PROVIDER_SITE_OTHER): Payer: 59 | Admitting: Internal Medicine

## 2019-04-07 ENCOUNTER — Encounter: Payer: Self-pay | Admitting: Internal Medicine

## 2019-04-07 ENCOUNTER — Other Ambulatory Visit: Payer: Self-pay

## 2019-04-07 DIAGNOSIS — E1165 Type 2 diabetes mellitus with hyperglycemia: Secondary | ICD-10-CM | POA: Diagnosis not present

## 2019-04-07 DIAGNOSIS — Z794 Long term (current) use of insulin: Secondary | ICD-10-CM

## 2019-04-07 DIAGNOSIS — E0500 Thyrotoxicosis with diffuse goiter without thyrotoxic crisis or storm: Secondary | ICD-10-CM | POA: Diagnosis not present
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
## 2019-04-07 NOTE — Patient Instructions (Addendum)
Please continue: - Metformin ER 500 mg twice a day  Also continue: - Methimazole 5 mg daily  Please check your sugars once a day, rotating check times.   Please check your pulse at home and if lower than 90 at rest, reduce Toprol-XL to every other day and then stop after approximately a week if pulse remains lower than 90.  Please go to [place] station for labs, fasting.  Please come back for a follow-up appointment in 3-4 months.

## 2019-04-08 ENCOUNTER — Encounter: Payer: Self-pay | Admitting: Internal Medicine

## 2019-04-13 NOTE — Telephone Encounter (Signed)
Copied from CRM [id number]. Topic: Appointment Scheduling - Scheduling Inquiry for Clinic >> Apr 12, 2019  4:55 PM Marylen Ponto wrote: Reason for CRM: Pt stated she has lab orders from Dr. Elvera Lennox with [facility] and she would like to schedule an appt to have the labs drawn. Pt requests call back. Cb# [phone]

## 2019-04-15 ENCOUNTER — Other Ambulatory Visit (INDEPENDENT_AMBULATORY_CARE_PROVIDER_SITE_OTHER): Payer: 59

## 2019-04-15 ENCOUNTER — Other Ambulatory Visit: Payer: Self-pay

## 2019-04-15 DIAGNOSIS — E1165 Type 2 diabetes mellitus with hyperglycemia: Secondary | ICD-10-CM | POA: Diagnosis not present

## 2019-04-15 DIAGNOSIS — E0500 Thyrotoxicosis with diffuse goiter without thyrotoxic crisis or storm: Secondary | ICD-10-CM

## 2019-04-15 DIAGNOSIS — Z794 Long term (current) use of insulin: Secondary | ICD-10-CM | POA: Diagnosis not present

## 2019-04-15 LAB — MICROALBUMIN / CREATININE URINE RATIO
Creatinine,U: 145.6 mg/dL
Microalb Creat Ratio: 4.5 mg/g (ref 0.0–30.0)
Microalb, Ur: 6.5 mg/dL — ABNORMAL HIGH (ref 0.0–1.9)

## 2019-04-15 LAB — LIPID PANEL
Cholesterol: 173 mg/dL (ref 0–200)
HDL: 44 mg/dL (ref >39.00)
LDL Cholesterol: 100 mg/dL — ABNORMAL HIGH (ref 0–99)
NonHDL: 129.03
Total CHOL/HDL Ratio: 4
Triglycerides: 144 mg/dL (ref 0.0–149.0)
VLDL: 28.8 mg/dL (ref 0.0–40.0)

## 2019-04-15 LAB — T4, FREE: Free T4: 0.87 ng/dL (ref 0.60–1.60)

## 2019-04-15 LAB — HEMOGLOBIN A1C: Hgb A1c MFr Bld: 7.1 % — ABNORMAL HIGH (ref 4.6–6.5)

## 2019-04-15 LAB — TSH: TSH: 0.42 u[IU]/mL (ref 0.35–4.50)

## 2019-04-15 LAB — T3, FREE: T3, Free: 3.7 pg/mL (ref 2.3–4.2)

## 2019-04-16 LAB — COMPLETE METABOLIC PANEL WITH GFR
AG Ratio: 1.4 (calc) (ref 1.0–2.5)
ALT: 13 U/L (ref 6–29)
AST: 12 U/L (ref 10–35)
Albumin: 4.2 g/dL (ref 3.6–5.1)
Alkaline phosphatase (APISO): 100 U/L (ref 31–125)
BUN: 11 mg/dL (ref 7–25)
CO2: 21 mmol/L (ref 20–32)
Calcium: 9.5 mg/dL (ref 8.6–10.2)
Chloride: 102 mmol/L (ref 98–110)
Creat: 0.65 mg/dL (ref 0.50–1.10)
GFR, Est African American: 121 mL/min/{1.73_m2} (ref >=60)
GFR, Est Non African American: 104 mL/min/{1.73_m2} (ref >=60)
Globulin: 3 g/dL (calc) (ref 1.9–3.7)
Glucose, Bld: 142 mg/dL — ABNORMAL HIGH (ref 65–99)
Potassium: 4.5 mmol/L (ref 3.5–5.3)
Sodium: 138 mmol/L (ref 135–146)
Total Bilirubin: 0.3 mg/dL (ref 0.2–1.2)
Total Protein: 7.2 g/dL (ref 6.1–8.1)

## 2019-08-10 ENCOUNTER — Other Ambulatory Visit: Payer: Self-pay

## 2019-08-12 ENCOUNTER — Encounter: Payer: Self-pay | Admitting: Internal Medicine

## 2019-08-12 ENCOUNTER — Other Ambulatory Visit: Payer: Self-pay

## 2019-08-12 ENCOUNTER — Ambulatory Visit: Payer: 59 | Admitting: Internal Medicine

## 2019-08-12 VITALS — BP 126/60 | HR 110 | Ht 66.75 in | Wt 266.0 lb

## 2019-08-12 DIAGNOSIS — E1165 Type 2 diabetes mellitus with hyperglycemia: Secondary | ICD-10-CM

## 2019-08-12 DIAGNOSIS — E0500 Thyrotoxicosis with diffuse goiter without thyrotoxic crisis or storm: Secondary | ICD-10-CM

## 2019-08-12 DIAGNOSIS — Z794 Long term (current) use of insulin: Secondary | ICD-10-CM | POA: Diagnosis not present

## 2019-08-12 LAB — POCT GLYCOSYLATED HEMOGLOBIN (HGB A1C): Hemoglobin A1C: 6.6 % — AB (ref 4.0–5.6)

## 2019-08-12 LAB — T3, FREE: T3, Free: 3.5 pg/mL (ref 2.3–4.2)

## 2019-08-12 LAB — TSH: TSH: 1.85 u[IU]/mL (ref 0.35–4.50)

## 2019-08-12 LAB — T4, FREE: Free T4: 0.79 ng/dL (ref 0.60–1.60)

## 2019-08-12 MED ORDER — METHIMAZOLE 5 MG PO TABS: 5.0000 mg | ORAL_TABLET | Freq: Every day | ORAL | 3 refills | Status: DC

## 2019-08-12 MED ORDER — METHIMAZOLE 5 MG PO TABS: 5.0000 mg | ORAL_TABLET | Freq: Three times a day (TID) | ORAL | 3 refills | Status: DC

## 2019-08-12 NOTE — Patient Instructions (Signed)
Please continue: - Metformin ER 500 mg 2x a day   Continue: Methimazole 5 mg daily  Please come back for a follow-up appointment in 3-4 months.
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
## 2019-08-18 LAB — THYROID STIMULATING IMMUNOGLOBULIN: TSI: 413 % baseline — ABNORMAL HIGH (ref <140)

## 2019-08-30 ENCOUNTER — Other Ambulatory Visit: Payer: Self-pay | Admitting: Internal Medicine

## 2019-08-31 IMAGING — MG DIGITAL SCREENING BILATERAL MAMMOGRAM WITH TOMO AND CAD
8 series · 8 of 24 positions shown · non-contrast
Comparison: Previous exam(s).

ACR Breast Density Category a: The breast tissue is almost entirely
fatty.

CLINICAL DATA: Screening.

EXAM:
DIGITAL SCREENING BILATERAL MAMMOGRAM WITH TOMO AND CAD

[R MLO synth-2D]
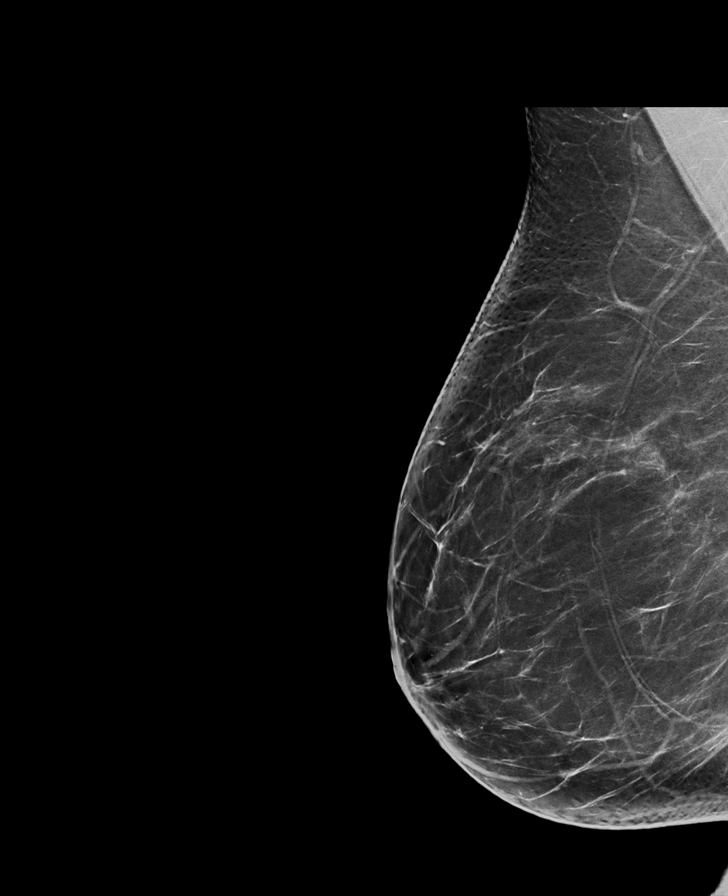

[R CC synth-2D]
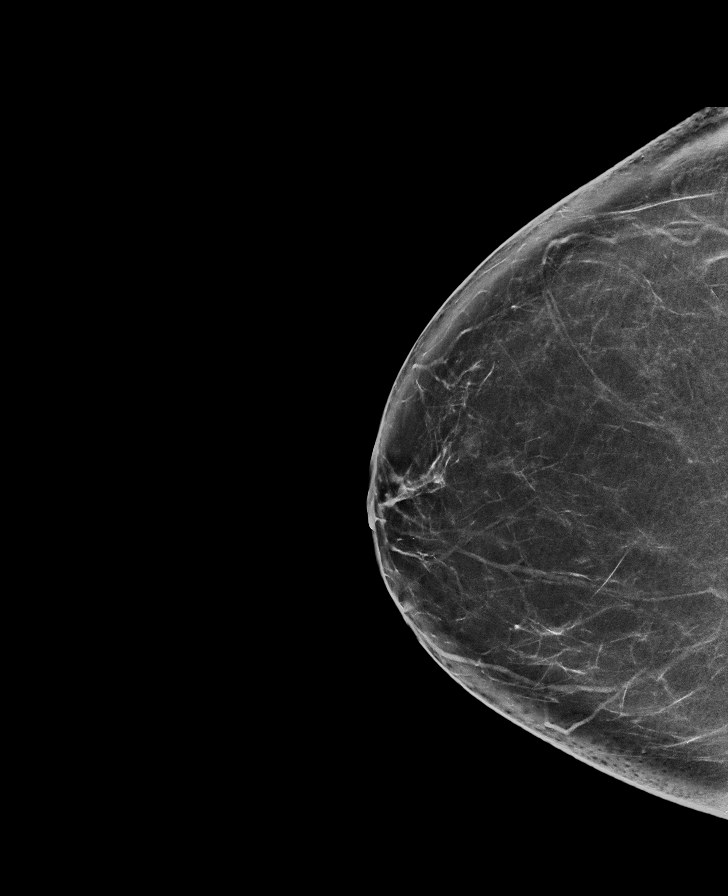

[L MLO synth-2D]
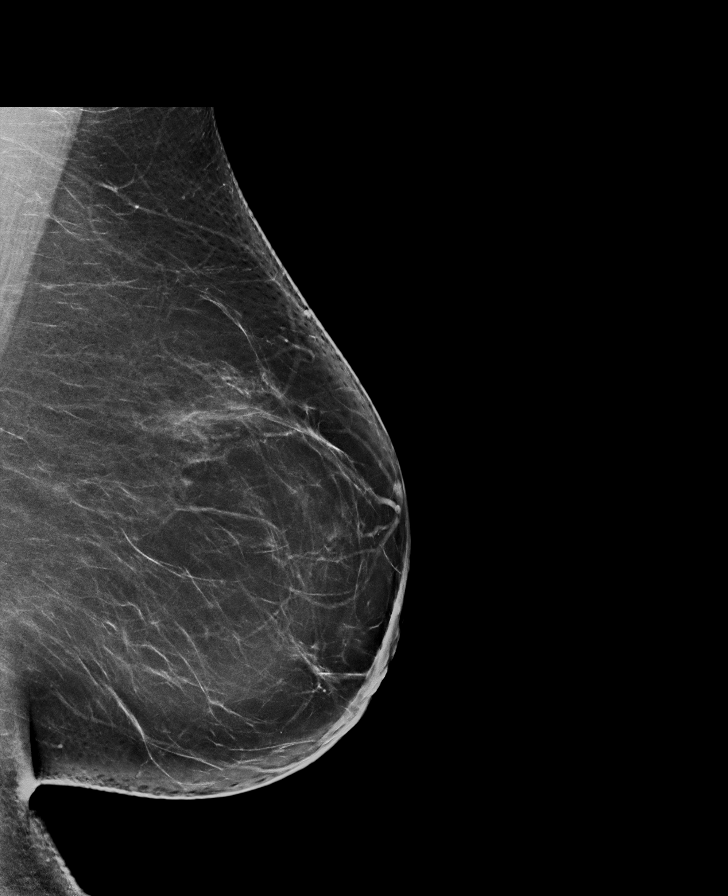

[L CC synth-2D]
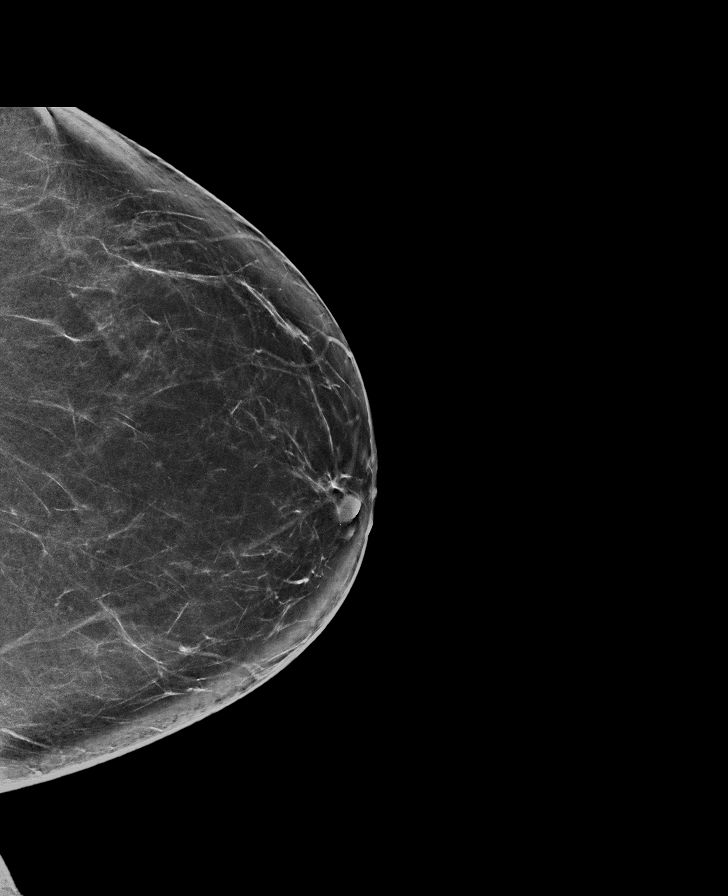

[L MLO tomo · tomo slice 46/91.0]
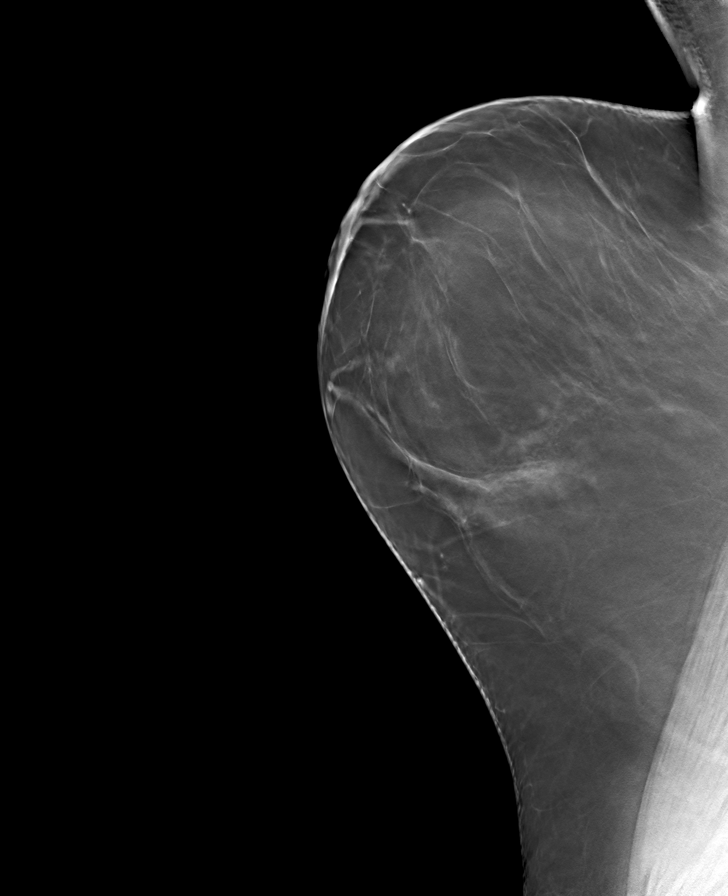

[R MLO tomo · tomo slice 42/83.0]
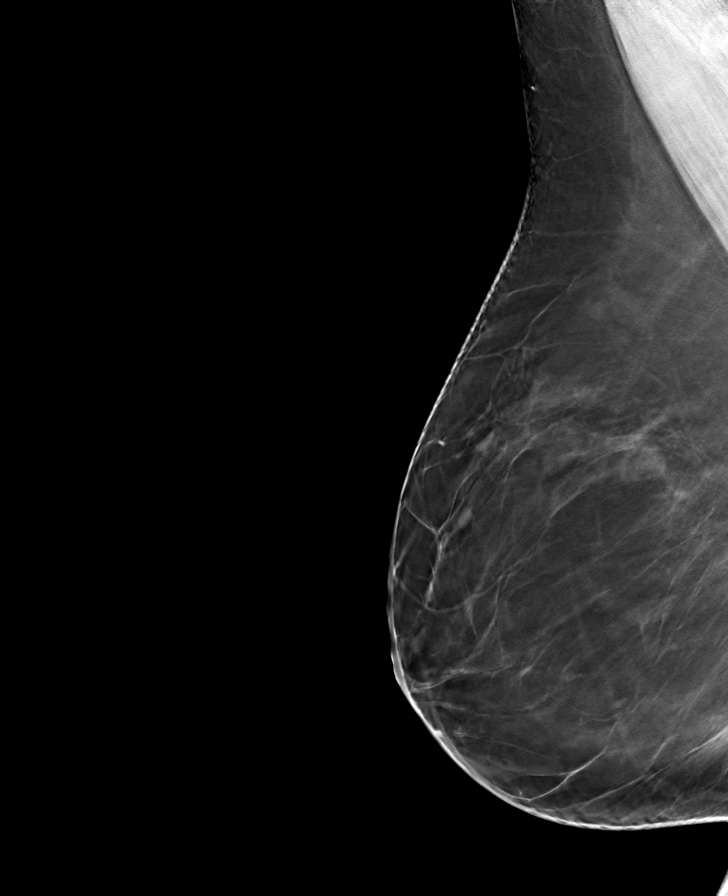

[R CC tomo · tomo slice 41/81.0]
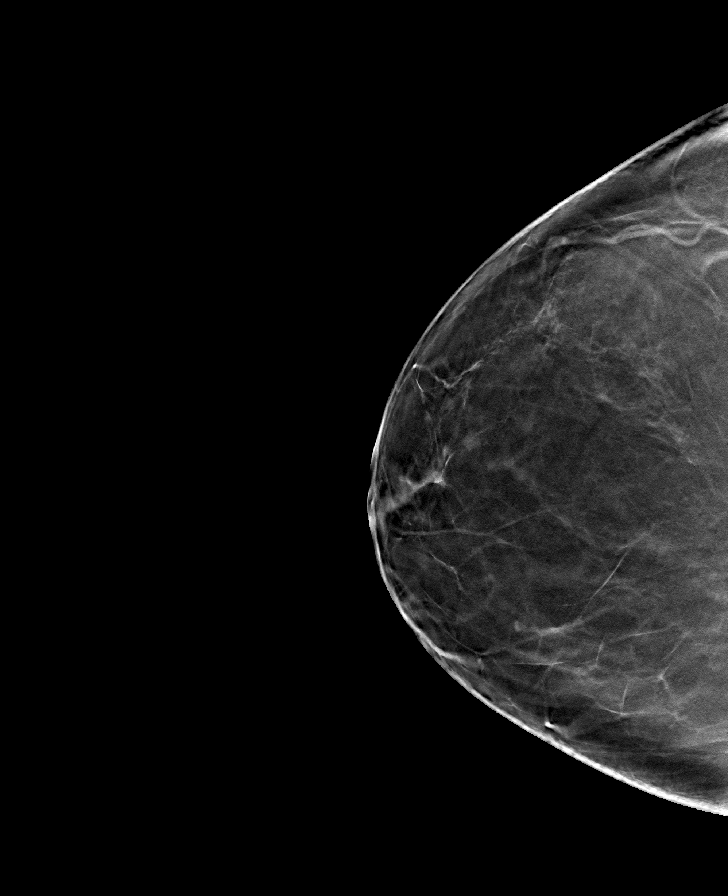

[L CC tomo · tomo slice 43/85.0]
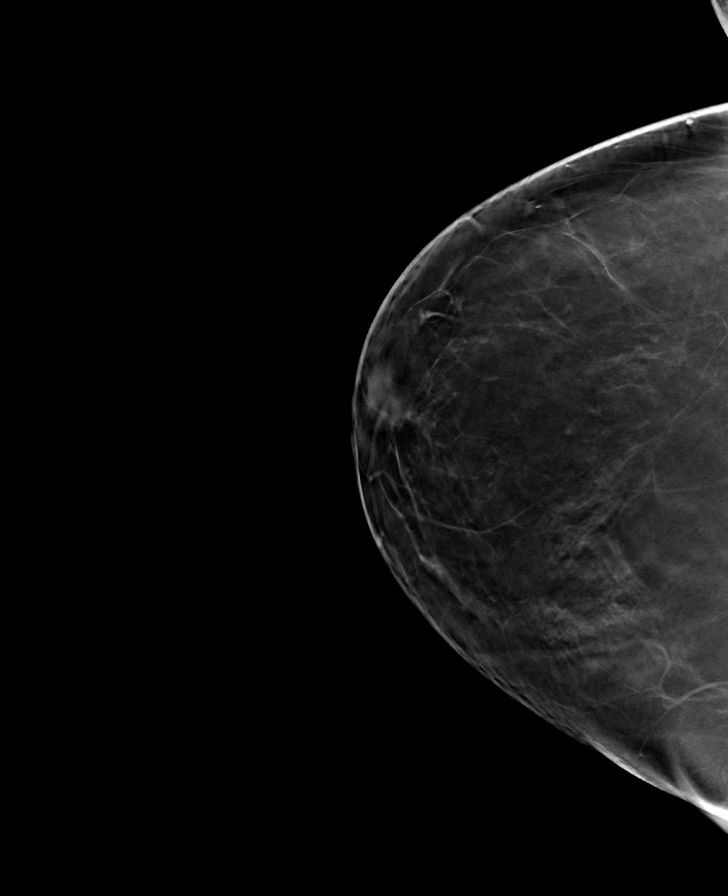

[8 of 24 positions shown; findings below may reference images not displayed]

FINDINGS: There are no findings suspicious for malignancy. Images were
processed with CAD.
IMPRESSION: No mammographic evidence of malignancy. A result letter of this
screening mammogram will be mailed directly to the patient.

RECOMMENDATION:
Screening mammogram in one year. (Code:[id number]-[id number])

BI-RADS CATEGORY  1: Negative.

## 2019-11-01 LAB — HM DIABETES EYE EXAM

## 2019-12-22 ENCOUNTER — Other Ambulatory Visit: Payer: Self-pay

## 2019-12-26 ENCOUNTER — Encounter: Payer: Self-pay | Admitting: Internal Medicine

## 2019-12-26 ENCOUNTER — Other Ambulatory Visit: Payer: Self-pay

## 2019-12-26 ENCOUNTER — Ambulatory Visit: Payer: 59 | Admitting: Internal Medicine

## 2019-12-26 VITALS — BP 140/80 | HR 106 | Ht 66.75 in | Wt 264.0 lb

## 2019-12-26 DIAGNOSIS — E66813 Obesity, class 3: Secondary | ICD-10-CM

## 2019-12-26 DIAGNOSIS — E1165 Type 2 diabetes mellitus with hyperglycemia: Secondary | ICD-10-CM

## 2019-12-26 DIAGNOSIS — Z794 Long term (current) use of insulin: Secondary | ICD-10-CM

## 2019-12-26 DIAGNOSIS — E0500 Thyrotoxicosis with diffuse goiter without thyrotoxic crisis or storm: Secondary | ICD-10-CM | POA: Diagnosis not present

## 2019-12-26 LAB — T4, FREE: Free T4: 0.86 ng/dL (ref 0.60–1.60)

## 2019-12-26 LAB — T3, FREE: T3, Free: 3.7 pg/mL (ref 2.3–4.2)

## 2019-12-26 LAB — POCT GLYCOSYLATED HEMOGLOBIN (HGB A1C): Hemoglobin A1C: 6.5 % — AB (ref 4.0–5.6)

## 2019-12-26 LAB — TSH: TSH: 1.17 u[IU]/mL (ref 0.35–4.50)

## 2019-12-26 MED ORDER — METFORMIN HCL ER 500 MG PO TB24: ORAL_TABLET | ORAL | 3 refills | Status: DC

## 2019-12-26 MED ORDER — ONETOUCH DELICA LANCETS 30G MISC: 1.0000 | Freq: Every day | 11 refills | Status: DC

## 2019-12-26 MED ORDER — METHIMAZOLE 5 MG PO TABS: 5.0000 mg | ORAL_TABLET | Freq: Every day | ORAL | 3 refills | Status: DC

## 2019-12-26 MED ORDER — ONETOUCH VERIO VI STRP: ORAL_STRIP | 12 refills | Status: DC
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
## 2019-12-26 NOTE — Patient Instructions (Signed)
Please continue: - Metformin ER 2x a day 500 mg   Also, continue: - Methimazole 5 mg daily  Please stop at the lab.  Please come back for a follow-up appointment in 4 months.

## 2020-01-27 ENCOUNTER — Other Ambulatory Visit: Payer: Self-pay | Admitting: Internal Medicine

## 2020-02-26 ENCOUNTER — Other Ambulatory Visit: Payer: Self-pay

## 2020-02-26 ENCOUNTER — Encounter (HOSPITAL_COMMUNITY): Payer: Self-pay | Admitting: Emergency Medicine

## 2020-02-26 ENCOUNTER — Emergency Department (HOSPITAL_COMMUNITY)
Admission: EM | Admit: 2020-02-26 | Discharge: 2020-02-26 | Disposition: A | Discharge status: Home / Self Care | Payer: 59 | Attending: Emergency Medicine | Admitting: Emergency Medicine

## 2020-02-26 DIAGNOSIS — M545 Low back pain: Secondary | ICD-10-CM | POA: Diagnosis not present

## 2020-02-26 DIAGNOSIS — Z5321 Procedure and treatment not carried out due to patient leaving prior to being seen by health care provider: Secondary | ICD-10-CM | POA: Insufficient documentation

## 2020-02-26 HISTORY — DX: Type 2 diabetes mellitus without complications: E11.9

## 2020-02-26 HISTORY — DX: Disorder of thyroid, unspecified: E07.9

## 2020-02-26 LAB — CBC WITH DIFFERENTIAL/PLATELET
Abs Immature Granulocytes: 0.07 10*3/uL (ref 0.00–0.07)
Basophils Absolute: 0.1 10*3/uL (ref 0.0–0.1)
Basophils Relative: 1 %
Eosinophils Absolute: 0.6 10*3/uL — ABNORMAL HIGH (ref 0.0–0.5)
Eosinophils Relative: 4 %
HCT: 42.6 % (ref 36.0–46.0)
Hemoglobin: 13.7 g/dL (ref 12.0–15.0)
Immature Granulocytes: 1 %
Lymphocytes Relative: 28 %
Lymphs Abs: 4.3 10*3/uL — ABNORMAL HIGH (ref 0.7–4.0)
MCH: 29.8 pg (ref 26.0–34.0)
MCHC: 32.2 g/dL (ref 30.0–36.0)
MCV: 92.8 fL (ref 80.0–100.0)
Monocytes Absolute: 1 10*3/uL (ref 0.1–1.0)
Monocytes Relative: 6 %
Neutro Abs: 9.3 10*3/uL — ABNORMAL HIGH (ref 1.7–7.7)
Neutrophils Relative %: 60 %
Platelets: 510 10*3/uL — ABNORMAL HIGH (ref 150–400)
RBC: 4.59 MIL/uL (ref 3.87–5.11)
RDW: 12.7 % (ref 11.5–15.5)
WBC: 15.3 10*3/uL — ABNORMAL HIGH (ref 4.0–10.5)
nRBC: 0 % (ref 0.0–0.2)

## 2020-02-26 LAB — COMPREHENSIVE METABOLIC PANEL
ALT: 17 U/L (ref 0–44)
AST: 17 U/L (ref 15–41)
Albumin: 3.5 g/dL (ref 3.5–5.0)
Alkaline Phosphatase: 80 U/L (ref 38–126)
Anion gap: 11 (ref 5–15)
BUN: 10 mg/dL (ref 6–20)
CO2: 26 mmol/L (ref 22–32)
Calcium: 9.2 mg/dL (ref 8.9–10.3)
Chloride: 100 mmol/L (ref 98–111)
Creatinine, Ser: 0.74 mg/dL (ref 0.44–1.00)
GFR calc Af Amer: >60 mL/min (ref >60)
GFR calc non Af Amer: >60 mL/min (ref >60)
Glucose, Bld: 137 mg/dL — ABNORMAL HIGH (ref 70–99)
Potassium: 4.2 mmol/L (ref 3.5–5.1)
Sodium: 137 mmol/L (ref 135–145)
Total Bilirubin: 0.4 mg/dL (ref 0.3–1.2)
Total Protein: 7 g/dL (ref 6.5–8.1)

## 2020-02-26 LAB — URINALYSIS, ROUTINE W REFLEX MICROSCOPIC
Bilirubin Urine: NEGATIVE
Glucose, UA: NEGATIVE mg/dL
Hgb urine dipstick: NEGATIVE
Ketones, ur: 5 mg/dL — AB
Nitrite: NEGATIVE
Protein, ur: NEGATIVE mg/dL
Specific Gravity, Urine: 1.027 (ref 1.005–1.030)
pH: 5 (ref 5.0–8.0)

## 2020-02-26 LAB — I-STAT BETA HCG BLOOD, ED (MC, WL, AP ONLY): I-stat hCG, quantitative: <5 m[IU]/mL (ref <5)

## 2020-02-26 NOTE — ED Triage Notes (Signed)
Patient reports persistent right low back pain onset last week , denies injury , no dysuria or hematuria , seen at [facility] clinic prescribed with pain medications with no relief .

## 2020-04-25 ENCOUNTER — Telehealth (INDEPENDENT_AMBULATORY_CARE_PROVIDER_SITE_OTHER): Payer: 59 | Admitting: Internal Medicine

## 2020-04-25 ENCOUNTER — Encounter: Payer: Self-pay | Admitting: Internal Medicine

## 2020-04-25 ENCOUNTER — Other Ambulatory Visit: Payer: Self-pay

## 2020-04-25 DIAGNOSIS — Z794 Long term (current) use of insulin: Secondary | ICD-10-CM | POA: Diagnosis not present

## 2020-04-25 DIAGNOSIS — E0500 Thyrotoxicosis with diffuse goiter without thyrotoxic crisis or storm: Secondary | ICD-10-CM

## 2020-04-25 DIAGNOSIS — E1165 Type 2 diabetes mellitus with hyperglycemia: Secondary | ICD-10-CM | POA: Diagnosis not present

## 2020-04-25 MED ORDER — METOPROLOL SUCCINATE ER 50 MG PO TB24: 150.0000 mg | ORAL_TABLET | Freq: Every day | ORAL | 3 refills | Status: AC

## 2020-04-25 MED ORDER — METHIMAZOLE 5 MG PO TABS: ORAL_TABLET | ORAL | 3 refills | Status: DC

## 2020-04-25 NOTE — Patient Instructions (Signed)
Please continue: - Metformin ER 2x a day 500 mg   Also, continue: - Methimazole 5 mg daily  Please come back for a follow-up appointment in 4 months.
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
## 2020-04-27 ENCOUNTER — Other Ambulatory Visit (INDEPENDENT_AMBULATORY_CARE_PROVIDER_SITE_OTHER): Payer: 59

## 2020-04-27 ENCOUNTER — Other Ambulatory Visit: Payer: Self-pay

## 2020-04-27 DIAGNOSIS — Z794 Long term (current) use of insulin: Secondary | ICD-10-CM | POA: Diagnosis not present

## 2020-04-27 DIAGNOSIS — E1165 Type 2 diabetes mellitus with hyperglycemia: Secondary | ICD-10-CM | POA: Diagnosis not present

## 2020-04-27 DIAGNOSIS — E0500 Thyrotoxicosis with diffuse goiter without thyrotoxic crisis or storm: Secondary | ICD-10-CM | POA: Diagnosis not present

## 2020-04-27 LAB — T4, FREE: Free T4: 0.75 ng/dL (ref 0.60–1.60)

## 2020-04-27 LAB — LIPID PANEL
Cholesterol: 175 mg/dL (ref 0–200)
HDL: 40.8 mg/dL (ref >39.00)
LDL Cholesterol: 99 mg/dL (ref 0–99)
NonHDL: 134.51
Total CHOL/HDL Ratio: 4
Triglycerides: 177 mg/dL — ABNORMAL HIGH (ref 0.0–149.0)
VLDL: 35.4 mg/dL (ref 0.0–40.0)

## 2020-04-27 LAB — T3, FREE: T3, Free: 3.4 pg/mL (ref 2.3–4.2)

## 2020-04-27 LAB — HEMOGLOBIN A1C: Hgb A1c MFr Bld: 6.8 % — ABNORMAL HIGH (ref 4.6–6.5)

## 2020-04-27 LAB — TSH: TSH: 1.51 u[IU]/mL (ref 0.35–4.50)

## 2020-08-23 ENCOUNTER — Encounter: Payer: Self-pay | Admitting: Internal Medicine

## 2020-08-23 ENCOUNTER — Telehealth (INDEPENDENT_AMBULATORY_CARE_PROVIDER_SITE_OTHER): Payer: 59 | Admitting: Internal Medicine

## 2020-08-23 ENCOUNTER — Other Ambulatory Visit: Payer: Self-pay

## 2020-08-23 DIAGNOSIS — Z794 Long term (current) use of insulin: Secondary | ICD-10-CM

## 2020-08-23 DIAGNOSIS — E1165 Type 2 diabetes mellitus with hyperglycemia: Secondary | ICD-10-CM

## 2020-08-23 DIAGNOSIS — E0500 Thyrotoxicosis with diffuse goiter without thyrotoxic crisis or storm: Secondary | ICD-10-CM | POA: Diagnosis not present
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
## 2020-08-23 NOTE — Patient Instructions (Addendum)
Please move Metformin ER 1000 mg with dinner.  Continue Methimazole for now at 5 mg daily.  Please come back for a follow-up appointment in 4 months.

## 2020-10-21 ENCOUNTER — Other Ambulatory Visit: Payer: Self-pay | Admitting: Internal Medicine

## 2020-12-24 ENCOUNTER — Ambulatory Visit: Payer: 59 | Admitting: Internal Medicine

## 2021-01-29 ENCOUNTER — Encounter: Payer: Self-pay | Admitting: Internal Medicine

## 2021-01-29 ENCOUNTER — Other Ambulatory Visit: Payer: Self-pay

## 2021-01-29 ENCOUNTER — Ambulatory Visit: Payer: 59 | Admitting: Internal Medicine

## 2021-01-29 VITALS — BP 128/78 | HR 86 | Ht 67.0 in | Wt 265.4 lb

## 2021-01-29 DIAGNOSIS — E1165 Type 2 diabetes mellitus with hyperglycemia: Secondary | ICD-10-CM

## 2021-01-29 DIAGNOSIS — Z794 Long term (current) use of insulin: Secondary | ICD-10-CM

## 2021-01-29 DIAGNOSIS — E0500 Thyrotoxicosis with diffuse goiter without thyrotoxic crisis or storm: Secondary | ICD-10-CM

## 2021-01-29 LAB — POCT GLYCOSYLATED HEMOGLOBIN (HGB A1C): Hemoglobin A1C: 7 % — AB (ref 4.0–5.6)

## 2021-01-29 MED ORDER — RYBELSUS 3 MG PO TABS: 3.0000 mg | ORAL_TABLET | Freq: Every day | ORAL | 1 refills | Status: DC

## 2021-01-29 NOTE — Patient Instructions (Addendum)
Please continue Metformin ER 500 mg 2x a day with meals.  Start Rybelsus 3 mg before b'fast x 2 weeks, then increase to 6 mg daily. Let me know if I need to send a prescription for the 7 mg tablet.  Continue Methimazole 5 mg daily.  Please stop at the lab.  Please come back for a follow-up appointment in 4 months.
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
## 2021-01-31 LAB — TSH: TSH: 1.9 u[IU]/mL (ref 0.35–4.50)

## 2021-01-31 LAB — T3, FREE: T3, Free: 3.5 pg/mL (ref 2.3–4.2)

## 2021-01-31 LAB — T4, FREE: Free T4: 0.73 ng/dL (ref 0.60–1.60)

## 2021-02-17 ENCOUNTER — Encounter: Payer: Self-pay | Admitting: Internal Medicine

## 2021-02-17 DIAGNOSIS — E1165 Type 2 diabetes mellitus with hyperglycemia: Secondary | ICD-10-CM

## 2021-02-17 DIAGNOSIS — Z794 Long term (current) use of insulin: Secondary | ICD-10-CM

## 2021-02-18 MED ORDER — RYBELSUS 7 MG PO TABS: 7.0000 mg | ORAL_TABLET | Freq: Every day | ORAL | 1 refills | Status: DC

## 2021-05-30 ENCOUNTER — Other Ambulatory Visit: Payer: Self-pay | Admitting: Internal Medicine

## 2021-06-06 ENCOUNTER — Ambulatory Visit: Payer: 59 | Admitting: Internal Medicine

## 2021-06-06 ENCOUNTER — Encounter: Payer: Self-pay | Admitting: Internal Medicine

## 2021-06-06 ENCOUNTER — Other Ambulatory Visit: Payer: Self-pay

## 2021-06-06 VITALS — BP 138/82 | HR 107 | Ht 67.0 in | Wt 250.0 lb

## 2021-06-06 DIAGNOSIS — Z794 Long term (current) use of insulin: Secondary | ICD-10-CM | POA: Diagnosis not present

## 2021-06-06 DIAGNOSIS — E1165 Type 2 diabetes mellitus with hyperglycemia: Secondary | ICD-10-CM | POA: Diagnosis not present

## 2021-06-06 DIAGNOSIS — E0500 Thyrotoxicosis with diffuse goiter without thyrotoxic crisis or storm: Secondary | ICD-10-CM

## 2021-06-06 LAB — T4, FREE: Free T4: 0.81 ng/dL (ref 0.60–1.60)

## 2021-06-06 LAB — TSH: TSH: 1.4 u[IU]/mL (ref 0.35–5.50)

## 2021-06-06 LAB — T3, FREE: T3, Free: 3.8 pg/mL (ref 2.3–4.2)

## 2021-06-06 LAB — POCT GLYCOSYLATED HEMOGLOBIN (HGB A1C): Hemoglobin A1C: 6.2 % — AB (ref 4.0–5.6)
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
## 2021-06-06 NOTE — Patient Instructions (Addendum)
Please continue: - Metformin ER 500 mg 2x a day with meals. - Rybelsus 7 mg before breakfast  Continue Methimazole 5 mg daily.  Please stop at the lab.  Please come back for a follow-up appointment in 4 months. 

## 2021-08-13 ENCOUNTER — Other Ambulatory Visit: Payer: Self-pay | Admitting: Internal Medicine

## 2021-08-13 DIAGNOSIS — Z794 Long term (current) use of insulin: Secondary | ICD-10-CM

## 2021-08-13 DIAGNOSIS — E1165 Type 2 diabetes mellitus with hyperglycemia: Secondary | ICD-10-CM

## 2021-10-03 ENCOUNTER — Encounter: Payer: Self-pay | Admitting: Internal Medicine

## 2021-10-03 ENCOUNTER — Ambulatory Visit: Payer: 59 | Admitting: Internal Medicine

## 2021-10-03 ENCOUNTER — Other Ambulatory Visit: Payer: Self-pay

## 2021-10-03 VITALS — BP 128/78 | HR 96 | Ht 67.0 in | Wt 240.6 lb

## 2021-10-03 DIAGNOSIS — E1165 Type 2 diabetes mellitus with hyperglycemia: Secondary | ICD-10-CM

## 2021-10-03 DIAGNOSIS — Z794 Long term (current) use of insulin: Secondary | ICD-10-CM | POA: Diagnosis not present

## 2021-10-03 DIAGNOSIS — E0500 Thyrotoxicosis with diffuse goiter without thyrotoxic crisis or storm: Secondary | ICD-10-CM | POA: Diagnosis not present

## 2021-10-03 LAB — POCT GLYCOSYLATED HEMOGLOBIN (HGB A1C): Hemoglobin A1C: 6.1 % — AB (ref 4.0–5.6)

## 2021-10-03 LAB — T3, FREE: T3, Free: 3.5 pg/mL (ref 2.3–4.2)

## 2021-10-03 LAB — T4, FREE: Free T4: 0.79 ng/dL (ref 0.60–1.60)

## 2021-10-03 LAB — TSH: TSH: 1.61 u[IU]/mL (ref 0.35–5.50)

## 2021-10-03 MED ORDER — METFORMIN HCL ER 500 MG PO TB24: ORAL_TABLET | ORAL | 3 refills | Status: DC
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
## 2021-10-03 NOTE — Patient Instructions (Signed)
Please continue: - Metformin ER 500 mg 2x a day with meals. - Rybelsus 7 mg before breakfast  Continue Methimazole 5 mg daily.  Please stop at the lab.  Please come back for a follow-up appointment in 4 months. 

## 2022-02-04 ENCOUNTER — Other Ambulatory Visit: Payer: Self-pay

## 2022-02-04 ENCOUNTER — Telehealth (INDEPENDENT_AMBULATORY_CARE_PROVIDER_SITE_OTHER): Payer: 59 | Admitting: Internal Medicine

## 2022-02-04 ENCOUNTER — Encounter: Payer: Self-pay | Admitting: Internal Medicine

## 2022-02-04 DIAGNOSIS — E0500 Thyrotoxicosis with diffuse goiter without thyrotoxic crisis or storm: Secondary | ICD-10-CM

## 2022-02-04 DIAGNOSIS — Z794 Long term (current) use of insulin: Secondary | ICD-10-CM | POA: Diagnosis not present

## 2022-02-04 DIAGNOSIS — E1165 Type 2 diabetes mellitus with hyperglycemia: Secondary | ICD-10-CM

## 2022-02-04 MED ORDER — RYBELSUS 7 MG PO TABS: 1.0000 | ORAL_TABLET | Freq: Every day | ORAL | 3 refills | Status: DC

## 2022-02-04 NOTE — Patient Instructions (Addendum)
Please continue: - Metformin ER 500 mg 2x a day with meals. - Rybelsus 7 mg before breakfast  Continue Methimazole 5 mg daily.  Please come back for a follow-up appointment in 4 months.
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
## 2022-02-07 ENCOUNTER — Ambulatory Visit: Payer: 59 | Admitting: Internal Medicine

## 2022-02-19 ENCOUNTER — Other Ambulatory Visit: Payer: Self-pay | Admitting: Internal Medicine

## 2022-02-19 DIAGNOSIS — E1165 Type 2 diabetes mellitus with hyperglycemia: Secondary | ICD-10-CM

## 2022-05-02 ENCOUNTER — Other Ambulatory Visit: Payer: Self-pay | Admitting: Internal Medicine

## 2022-06-27 ENCOUNTER — Encounter: Payer: Self-pay | Admitting: Internal Medicine

## 2022-06-27 ENCOUNTER — Ambulatory Visit: Payer: 59 | Admitting: Internal Medicine

## 2022-06-27 VITALS — BP 120/68 | HR 109 | Ht 67.0 in | Wt 228.0 lb

## 2022-06-27 DIAGNOSIS — E0500 Thyrotoxicosis with diffuse goiter without thyrotoxic crisis or storm: Secondary | ICD-10-CM

## 2022-06-27 DIAGNOSIS — E1165 Type 2 diabetes mellitus with hyperglycemia: Secondary | ICD-10-CM

## 2022-06-27 DIAGNOSIS — Z794 Long term (current) use of insulin: Secondary | ICD-10-CM | POA: Diagnosis not present

## 2022-06-27 LAB — T4, FREE: Free T4: 0.68 ng/dL (ref 0.60–1.60)

## 2022-06-27 LAB — T3, FREE: T3, Free: 3.4 pg/mL (ref 2.3–4.2)

## 2022-06-27 LAB — POCT GLYCOSYLATED HEMOGLOBIN (HGB A1C): Hemoglobin A1C: 6 % — AB (ref 4.0–5.6)

## 2022-06-27 LAB — TSH: TSH: 1.99 u[IU]/mL (ref 0.35–5.50)

## 2022-06-27 MED ORDER — METFORMIN HCL ER 500 MG PO TB24: 500.0000 mg | ORAL_TABLET | Freq: Two times a day (BID) | ORAL | 3 refills | Status: DC

## 2022-06-27 MED ORDER — RYBELSUS 7 MG PO TABS
1.0000 | ORAL_TABLET | Freq: Every day | ORAL | 5 refills | Status: DC
Start: 2022-06-27 — End: 2023-05-05

## 2022-06-27 MED ORDER — METHIMAZOLE 5 MG PO TABS
ORAL_TABLET | ORAL | 3 refills | Status: DC
Start: 2022-06-27 — End: 2023-06-23
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
## 2022-06-27 NOTE — Patient Instructions (Signed)
Please continue: - Metformin ER 500 mg 2x a day with meals. - Rybelsus 7 mg before breakfast  Continue Methimazole 5 mg daily.  Please stop at the lab.  Please come back for a follow-up appointment in 4 months.

## 2022-09-03 LAB — HM DIABETES EYE EXAM

## 2022-10-08 ENCOUNTER — Encounter: Payer: Self-pay | Admitting: Internal Medicine

## 2022-12-22 ENCOUNTER — Ambulatory Visit: Payer: Medicaid Other | Admitting: Internal Medicine

## 2022-12-22 ENCOUNTER — Encounter: Payer: Self-pay | Admitting: Internal Medicine

## 2022-12-22 VITALS — BP 112/60 | HR 84 | Ht 67.0 in | Wt 232.0 lb

## 2022-12-22 DIAGNOSIS — Z794 Long term (current) use of insulin: Secondary | ICD-10-CM

## 2022-12-22 DIAGNOSIS — E0500 Thyrotoxicosis with diffuse goiter without thyrotoxic crisis or storm: Secondary | ICD-10-CM | POA: Diagnosis not present

## 2022-12-22 DIAGNOSIS — E1165 Type 2 diabetes mellitus with hyperglycemia: Secondary | ICD-10-CM | POA: Diagnosis not present

## 2022-12-22 LAB — POCT GLYCOSYLATED HEMOGLOBIN (HGB A1C): Hemoglobin A1C: 5.9 % — AB (ref 4.0–5.6)

## 2022-12-22 MED ORDER — ONETOUCH VERIO VI STRP: ORAL_STRIP | 3 refills | Status: DC

## 2022-12-22 MED ORDER — ONETOUCH VERIO FLEX SYSTEM W/DEVICE KIT
PACK | 0 refills | Status: DC
Start: 2022-12-22 — End: 2024-05-27
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
## 2022-12-22 NOTE — Patient Instructions (Signed)
Please continue: - Metformin ER 500 mg 2x a day with meals. - Rybelsus 7 mg before breakfast  Continue Methimazole 5 mg daily.  Please come back for a follow-up appointment in 6 months.

## 2023-01-01 ENCOUNTER — Other Ambulatory Visit: Payer: Self-pay | Admitting: Family Medicine

## 2023-01-01 DIAGNOSIS — Z1231 Encounter for screening mammogram for malignant neoplasm of breast: Secondary | ICD-10-CM

## 2023-01-20 ENCOUNTER — Ambulatory Visit
Admission: RE | Admit: 2023-01-20 | Discharge: 2023-01-20 | Disposition: A | Discharge status: Home / Self Care | Payer: Medicaid Other | Source: Ambulatory Visit | Attending: Family Medicine | Admitting: Family Medicine

## 2023-01-20 DIAGNOSIS — Z1231 Encounter for screening mammogram for malignant neoplasm of breast: Secondary | ICD-10-CM | POA: Insufficient documentation

## 2023-04-13 ENCOUNTER — Encounter: Payer: Self-pay | Admitting: Psychiatry

## 2023-04-13 ENCOUNTER — Ambulatory Visit
Admission: RE | Admit: 2023-04-13 | Discharge: 2023-04-13 | Disposition: A | Discharge status: Home / Self Care | Payer: Medicaid Other | Source: Ambulatory Visit | Attending: Psychiatry | Admitting: Psychiatry

## 2023-04-13 ENCOUNTER — Other Ambulatory Visit
Admission: RE | Admit: 2023-04-13 | Discharge: 2023-04-13 | Disposition: A | Discharge status: Home / Self Care | Payer: Medicaid Other | Source: Ambulatory Visit | Attending: Psychiatry | Admitting: Psychiatry

## 2023-04-13 ENCOUNTER — Ambulatory Visit (INDEPENDENT_AMBULATORY_CARE_PROVIDER_SITE_OTHER): Payer: Medicaid Other | Admitting: Psychiatry

## 2023-04-13 VITALS — BP 143/84 | HR 83 | Temp 97.4°F | Ht 67.0 in | Wt 241.4 lb

## 2023-04-13 DIAGNOSIS — Z634 Disappearance and death of family member: Secondary | ICD-10-CM

## 2023-04-13 DIAGNOSIS — Z9189 Other specified personal risk factors, not elsewhere classified: Secondary | ICD-10-CM

## 2023-04-13 DIAGNOSIS — F419 Anxiety disorder, unspecified: Secondary | ICD-10-CM

## 2023-04-13 DIAGNOSIS — F332 Major depressive disorder, recurrent severe without psychotic features: Secondary | ICD-10-CM | POA: Insufficient documentation

## 2023-04-13 DIAGNOSIS — F411 Generalized anxiety disorder: Secondary | ICD-10-CM | POA: Insufficient documentation

## 2023-04-13 MED ORDER — QUETIAPINE FUMARATE 50 MG PO TABS: 50.0000 mg | ORAL_TABLET | Freq: Every day | ORAL | 1 refills | Status: DC

## 2023-04-13 NOTE — Patient Instructions (Addendum)
Please call for EKG - [phone]  Bupropion Tablets (Depression/Mood Disorders) What is this medication? BUPROPION (byoo PROE pee on) treats depression. It increases norepinephrine and dopamine in the brain, hormones that help regulate mood. It belongs to a group of medications called NDRIs. This medicine may be used for other purposes; ask your health care provider or pharmacist if you have questions. COMMON BRAND NAME(S): Wellbutrin What should I tell my care team before I take this medication? They need to know if you have any of these conditions: An eating disorder, such as anorexia or bulimia Bipolar disorder or psychosis Diabetes or high blood sugar, treated with medication Glaucoma Heart disease, previous heart attack, or irregular heart beat Head injury or brain tumor High blood pressure Kidney or liver disease Seizures Suicidal thoughts or a previous suicide attempt Tourette's syndrome Weight loss An unusual or allergic reaction to bupropion, other medications, foods, dyes, or preservatives Pregnant or trying to become pregnant Breast-feeding How should I use this medication? Take this medication by mouth with a glass of water. Follow the directions on the prescription label. You can take it with or without food. If it upsets your stomach, take it with food. Take your medication at regular intervals. Do not take your medication more often than directed. Do not stop taking this medication suddenly except upon the advice of your care team. Stopping this medication too quickly may cause serious side effects or your condition may worsen. A special MedGuide will be given to you by the pharmacist with each prescription and refill. Be sure to read this information carefully each time. Talk to your care team regarding the use of this medication in children. Special care may be needed. Overdosage: If you think you have taken too much of this medicine contact a poison control center or  emergency room at once. NOTE: This medicine is only for you. Do not share this medicine with others. What if I miss a dose? If you miss a dose, take it as soon as you can. If it is less than four hours to your next dose, take only that dose and skip the missed dose. Do not take double or extra doses. What may interact with this medication? Do not take this medication with any of the following: Linezolid MAOIs like Azilect, Carbex, Eldepryl, Marplan, Nardil, and Parnate Methylene blue (injected into a vein) Other medications that contain bupropion like Zyban This medication may also interact with the following: Alcohol Certain medications for anxiety or sleep Certain medications for blood pressure like metoprolol, propranolol Certain medications for depression or psychotic disturbances Certain medications for HIV or AIDS like efavirenz, lopinavir, nelfinavir, ritonavir Certain medications for irregular heart beat like propafenone, flecainide Certain medications for Parkinson's disease like amantadine, levodopa Certain medications for seizures like carbamazepine, phenytoin, phenobarbital Cimetidine Clopidogrel Cyclophosphamide Digoxin Furazolidone Isoniazid Nicotine Orphenadrine Procarbazine Steroid medications like prednisone or cortisone Stimulant medications for attention disorders, weight loss, or to stay awake Tamoxifen Theophylline Thiotepa Ticlopidine Tramadol Warfarin This list may not describe all possible interactions. Give your health care provider a list of all the medicines, herbs, non-prescription drugs, or dietary supplements you use. Also tell them if you smoke, drink alcohol, or use illegal drugs. Some items may interact with your medicine. What should I watch for while using this medication? Tell your care team if your symptoms do not get better or if they get worse. Visit your care team for regular checks on your progress. Because it may take several weeks to  see the full effects of this medication, it is important to continue your treatment as prescribed. Watch for new or worsening thoughts of suicide or depression. This includes sudden changes in mood, behavior, or thoughts. These changes can happen at any time but are more common in the beginning of treatment or after a change in dose. Call your care team right away if you experience these thoughts or worsening depression. Manic episodes may happen in patients with bipolar disorder who take this medication. Watch for changes in feelings or behaviors such as feeling anxious, nervous, agitated, panicky, irritable, hostile, aggressive, impulsive, severely restless, overly excited and hyperactive, or trouble sleeping. These symptoms can happen at anytime but are more common in the beginning of treatment or after a change in dose. Call your care team right away if you notice any of these symptoms. This medication may cause serious skin reactions. They can happen weeks to months after starting the medication. Contact your care team right away if you notice fevers or flu-like symptoms with a rash. The rash may be red or purple and then turn into blisters or peeling of the skin. Or, you might notice a red rash with swelling of the face, lips or lymph nodes in your neck or under your arms. Avoid drinks that contain alcohol while taking this medication. Drinking large amounts of alcohol, using sleeping or anxiety medications, or quickly stopping the use of these agents while taking this medication may increase your risk for a seizure. Do not drive or use heavy machinery until you know how this medication affects you. This medication can impair your ability to perform these tasks. Do not take this medication close to bedtime. It may prevent you from sleeping. Your mouth may get dry. Chewing sugarless gum or sucking hard candy, and drinking plenty of water may help. Contact your care team if the problem does not go away or  is severe. What side effects may I notice from receiving this medication? Side effects that you should report to your care team as soon as possible: Allergic reactions--skin rash, itching, hives, swelling of the face, lips, tongue, or throat Increase in blood pressure Mood and behavior changes--anxiety, nervousness, confusion, hallucinations, irritability, hostility, thoughts of suicide or self-harm, worsening mood, feelings of depression Redness, blistering, peeling, or loosening of the skin, including inside the mouth Seizures Sudden eye pain or change in vision such as blurry vision, seeing halos around lights, vision loss Side effects that usually do not require medical attention (report to your care team if they continue or are bothersome): Constipation Dizziness Dry mouth Loss of appetite Nausea Tremors or shaking Trouble sleeping This list may not describe all possible side effects. Call your doctor for medical advice about side effects. You may report side effects to FDA at 1-800-FDA-1088. Where should I keep my medication? Keep out of the reach of children and pets. Store at room temperature between 20 and 25 degrees C (68 and 77 degrees F), away from direct sunlight and moisture. Keep tightly closed. Throw away any unused medication after the expiration date. NOTE: This sheet is a summary. It may not cover all possible information. If you have questions about this medicine, talk to your doctor, pharmacist, or health care provider.  2023 Elsevier/Gold Standard (2020-11-26 00:00:00)
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
## 2023-04-14 ENCOUNTER — Telehealth (HOSPITAL_COMMUNITY): Payer: Self-pay | Admitting: Professional

## 2023-04-15 ENCOUNTER — Ambulatory Visit (HOSPITAL_COMMUNITY): Payer: Medicaid Other | Admitting: Licensed Clinical Social Worker

## 2023-04-15 DIAGNOSIS — F332 Major depressive disorder, recurrent severe without psychotic features: Secondary | ICD-10-CM

## 2023-04-15 LAB — URINE DRUGS OF ABUSE SCREEN W ALC, ROUTINE (REF LAB)
Amphetamines, Urine: NEGATIVE ng/mL
Barbiturate, Ur: NEGATIVE ng/mL
Benzodiazepine Quant, Ur: NEGATIVE ng/mL
Cannabinoid Quant, Ur: NEGATIVE ng/mL
Cocaine (Metab.): NEGATIVE ng/mL
Ethanol U, Quan: NEGATIVE %
Methadone Screen, Urine: NEGATIVE ng/mL
Opiate Quant, Ur: NEGATIVE ng/mL
Phencyclidine, Ur: NEGATIVE ng/mL
Propoxyphene, Urine: NEGATIVE ng/mL

## 2023-04-16 ENCOUNTER — Telehealth: Payer: Self-pay

## 2023-04-16 ENCOUNTER — Ambulatory Visit (HOSPITAL_COMMUNITY): Payer: Medicaid Other
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
## 2023-04-16 NOTE — Telephone Encounter (Signed)
received fax that the quetiapine fumarate 50mg  was approved 04-14-23 to 04-13-24

## 2023-04-17 ENCOUNTER — Telehealth: Payer: Self-pay | Admitting: Psychiatry

## 2023-04-17 NOTE — Telephone Encounter (Signed)
Attempted to contact patient to discuss EKG and to consider starting Wellbutrin as discussed at her visit.  Had to leave a voicemail.

## 2023-04-20 ENCOUNTER — Ambulatory Visit (INDEPENDENT_AMBULATORY_CARE_PROVIDER_SITE_OTHER): Payer: Medicaid Other | Admitting: Licensed Clinical Social Worker

## 2023-04-20 ENCOUNTER — Encounter (HOSPITAL_COMMUNITY): Payer: Self-pay

## 2023-04-20 ENCOUNTER — Telehealth: Payer: Self-pay | Admitting: Psychiatry

## 2023-04-20 DIAGNOSIS — F332 Major depressive disorder, recurrent severe without psychotic features: Secondary | ICD-10-CM

## 2023-04-20 DIAGNOSIS — Z634 Disappearance and death of family member: Secondary | ICD-10-CM

## 2023-04-20 MED ORDER — BUPROPION HCL 75 MG PO TABS
75.0000 mg | ORAL_TABLET | Freq: Every day | ORAL | 0 refills | Status: DC
Start: 2023-04-20 — End: 2023-04-30

## 2023-04-20 MED ORDER — TRAZODONE HCL 50 MG PO TABS
50.0000 mg | ORAL_TABLET | Freq: Every day | ORAL | 0 refills | Status: DC
Start: 2023-04-20 — End: 2023-04-30

## 2023-04-20 MED ORDER — HYDROXYZINE PAMOATE 25 MG PO CAPS
25.0000 mg | ORAL_CAPSULE | Freq: Three times a day (TID) | ORAL | 0 refills | Status: DC | PRN
Start: 2023-04-20 — End: 2023-04-27
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
## 2023-04-20 NOTE — Telephone Encounter (Signed)
Attempted to contact patient again, had to leave a voicemail.-Time right now 5:45 PM.

## 2023-04-20 NOTE — Telephone Encounter (Signed)
Patient called back after she heard your voicemail, patient wants you to know she is attending the group therapy now and the provider there wants to change some of her medication. Patient also stated the provider at group therapy gave her the EKG test results. Patient requested a call back any day after 12 pm-please advise

## 2023-04-20 NOTE — Progress Notes (Signed)
Spoke with patient via Teams, used 2 identifiers to correctly identify patient. States that groups are going OK so far. She is dealing with a lot of grief. Her husband of 30 years died in 05/15/2022 after a short battle with bile duct cancer. She is heartbroken and was also laid off work in August 2023. Has one son who is married living on his own. On scale 1-10 a 10 being worst she rates depression at 5 and anxiety at 7. Denies SI/HI or AV hallucinations. PHQ9=15. No issues or complaints.

## 2023-04-21 ENCOUNTER — Ambulatory Visit (INDEPENDENT_AMBULATORY_CARE_PROVIDER_SITE_OTHER): Payer: Medicaid Other | Admitting: Licensed Clinical Social Worker

## 2023-04-21 DIAGNOSIS — Z634 Disappearance and death of family member: Secondary | ICD-10-CM

## 2023-04-21 DIAGNOSIS — F332 Major depressive disorder, recurrent severe without psychotic features: Secondary | ICD-10-CM

## 2023-04-21 NOTE — Telephone Encounter (Signed)
prior auth was approved for the quetiapine., pa case # [id number] from 04-14-23 to 04-13-24

## 2023-04-22 ENCOUNTER — Ambulatory Visit (INDEPENDENT_AMBULATORY_CARE_PROVIDER_SITE_OTHER): Payer: Medicaid Other | Admitting: Licensed Clinical Social Worker

## 2023-04-22 DIAGNOSIS — Z634 Disappearance and death of family member: Secondary | ICD-10-CM

## 2023-04-22 DIAGNOSIS — F332 Major depressive disorder, recurrent severe without psychotic features: Secondary | ICD-10-CM | POA: Diagnosis not present

## 2023-04-23 ENCOUNTER — Ambulatory Visit (INDEPENDENT_AMBULATORY_CARE_PROVIDER_SITE_OTHER): Payer: Medicaid Other | Admitting: Licensed Clinical Social Worker

## 2023-04-23 DIAGNOSIS — F332 Major depressive disorder, recurrent severe without psychotic features: Secondary | ICD-10-CM

## 2023-04-23 DIAGNOSIS — Z634 Disappearance and death of family member: Secondary | ICD-10-CM

## 2023-04-23 DIAGNOSIS — F419 Anxiety disorder, unspecified: Secondary | ICD-10-CM
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
## 2023-04-24 ENCOUNTER — Ambulatory Visit (INDEPENDENT_AMBULATORY_CARE_PROVIDER_SITE_OTHER): Payer: Medicaid Other | Admitting: Licensed Clinical Social Worker

## 2023-04-24 DIAGNOSIS — Z634 Disappearance and death of family member: Secondary | ICD-10-CM

## 2023-04-24 DIAGNOSIS — F332 Major depressive disorder, recurrent severe without psychotic features: Secondary | ICD-10-CM | POA: Diagnosis not present

## 2023-04-27 ENCOUNTER — Ambulatory Visit (INDEPENDENT_AMBULATORY_CARE_PROVIDER_SITE_OTHER): Payer: Medicaid Other | Admitting: Licensed Clinical Social Worker

## 2023-04-27 DIAGNOSIS — Z634 Disappearance and death of family member: Secondary | ICD-10-CM

## 2023-04-27 DIAGNOSIS — F332 Major depressive disorder, recurrent severe without psychotic features: Secondary | ICD-10-CM | POA: Diagnosis not present

## 2023-04-27 DIAGNOSIS — F419 Anxiety disorder, unspecified: Secondary | ICD-10-CM

## 2023-04-27 NOTE — Progress Notes (Signed)
Spoke with patient via Teams video call, used 2 identifiers to correctly identify patient. States she is sleeping pretty good. Has trouble going to sleep and remembering to take her Trazodone. She is going to start putting it beside her bed so she remembers to take it 30 mins before she is ready to sleep. Denies SI/HI or AV hallucinations. On scale 1-10 as 10 being worst she rates depression at 4 and anxiety at 2. No side effects from medications. No issues or complaints. Pleasant with bright affect, smiling and making small talk about her pet parrot Lucy.
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
## 2023-04-28 ENCOUNTER — Ambulatory Visit (INDEPENDENT_AMBULATORY_CARE_PROVIDER_SITE_OTHER): Payer: Medicaid Other | Admitting: Licensed Clinical Social Worker

## 2023-04-28 DIAGNOSIS — Z634 Disappearance and death of family member: Secondary | ICD-10-CM

## 2023-04-28 DIAGNOSIS — F332 Major depressive disorder, recurrent severe without psychotic features: Secondary | ICD-10-CM | POA: Diagnosis not present

## 2023-04-29 ENCOUNTER — Ambulatory Visit (HOSPITAL_COMMUNITY): Payer: Medicaid Other

## 2023-04-30 ENCOUNTER — Ambulatory Visit (INDEPENDENT_AMBULATORY_CARE_PROVIDER_SITE_OTHER): Payer: Medicaid Other | Admitting: Licensed Clinical Social Worker

## 2023-04-30 ENCOUNTER — Encounter (HOSPITAL_COMMUNITY): Payer: Self-pay | Admitting: Licensed Clinical Social Worker

## 2023-04-30 DIAGNOSIS — F332 Major depressive disorder, recurrent severe without psychotic features: Secondary | ICD-10-CM

## 2023-04-30 DIAGNOSIS — Z634 Disappearance and death of family member: Secondary | ICD-10-CM

## 2023-04-30 MED ORDER — BUPROPION HCL 75 MG PO TABS
75.0000 mg | ORAL_TABLET | Freq: Every day | ORAL | 0 refills | Status: DC
Start: 2023-04-30 — End: 2023-05-20

## 2023-04-30 MED ORDER — TRAZODONE HCL 50 MG PO TABS
50.0000 mg | ORAL_TABLET | Freq: Every day | ORAL | 0 refills | Status: AC
Start: 2023-04-30 — End: ?

## 2023-04-30 NOTE — Progress Notes (Signed)
Virtual Visit via Video Note  I connected with Sabrina Palmer on 04/30/23 at  9:00 AM EDT by a video enabled telemedicine application and verified that I am speaking with the correct person using two identifiers.  Location: Patient: Home  Provider:  Office   I discussed the limitations of evaluation and management by telemedicine and the availability of in person appointments. The patient expressed understanding and agreed to proceed.    I discussed the assessment and treatment plan with the patient. The patient was provided an opportunity to ask questions and all were answered. The patient agreed with the plan and demonstrated an understanding of the instructions.   The patient was advised to call back or seek an in-person evaluation if the symptoms worsen or if the condition fails to improve as anticipated.  I provided 15 minutes of non-face-to-face time during this encounter.   Oneta Rack, NP   [facility] Dis[facility]  Sabrina Palmer [id number]  Admission date: 04/20/2023 Discharge date: 05/01/2023  Reason for admission:  Per admission assessment note: " Sabrina Palmer is a 52 year old Caucasian female, currently unemployed, has a history of diabetes melitis 2, essential hide of attention, hyperthyroidism, depression, grief reaction was evaluated in office today, presented to establish care. Patient reports that she has been very depressed since 2022/07/30, when her husband died of cancer. She reports that they had a very good marriage and she had been taking care of I'm. She was then laid off from her job in 07/2022. She reports that she has been crying everyday. She reports that she has been restricted from her grandson (who is now 62 yr old) and her son wont speak to her. She reports that she feels really deserted and her reason to live is her pet bird right now. She also reports that she is trying to live for the  sake of her husband "    Progress in Program Toward Treatment Goals: Progressing -ongoing patient attended to participated with daily group session with active engaged participation.  Denying any suicidal or homicidal ideations at discharge.  Denied auditory or visual hallucinations.  Reports she has been taking her medications as indicated.  She is requesting a medication refill at this time.  Medications was prescribed for 10 days.  Unsure if patient has followed up with EKG as indicated by attending psychiatrist.  Will make medications available x 20 days as patient reports a follow-up late June with psychiatrist Eappen.  Support,encouragement reassurance was provided.  Progress (rationale): Reports overall group setting was helpful with coping skills.  States she has valuable information related to improving her sleep patterns.  States the negative to group setting was that she was not always able to talk due to the number of people in group. No other concerns noted at this time.  Collaboration of Care: Medication Management AEB refilled medications and Psychiatrist AEB Eappen   Patient/Guardian was advised Release of Information must be obtained prior to any record release in order to collaborate their care with an outside provider. Patient/Guardian was advised if they have not already done so to contact the registration department to sign all necessary forms in order for [place] to release information regarding their care.   Consent: Patient/Guardian gives verbal consent for treatment and assignment of benefits for services provided during this visit. Patient/Guardian expressed understanding and agreed to proceed.    Hillery Jacks NP  04/30/2023

## 2023-05-01 ENCOUNTER — Ambulatory Visit (INDEPENDENT_AMBULATORY_CARE_PROVIDER_SITE_OTHER): Payer: Medicaid Other | Admitting: Licensed Clinical Social Worker

## 2023-05-01 DIAGNOSIS — F332 Major depressive disorder, recurrent severe without psychotic features: Secondary | ICD-10-CM | POA: Diagnosis not present

## 2023-05-01 DIAGNOSIS — Z634 Disappearance and death of family member: Secondary | ICD-10-CM

## 2023-05-04 ENCOUNTER — Other Ambulatory Visit (HOSPITAL_COMMUNITY): Payer: Self-pay | Admitting: Family

## 2023-05-04 ENCOUNTER — Other Ambulatory Visit: Payer: Self-pay | Admitting: Internal Medicine

## 2023-05-04 DIAGNOSIS — Z794 Long term (current) use of insulin: Secondary | ICD-10-CM

## 2023-05-04 DIAGNOSIS — Z634 Disappearance and death of family member: Secondary | ICD-10-CM

## 2023-05-04 DIAGNOSIS — F332 Major depressive disorder, recurrent severe without psychotic features: Secondary | ICD-10-CM

## 2023-05-05 ENCOUNTER — Ambulatory Visit (HOSPITAL_COMMUNITY): Payer: Medicaid Other

## 2023-05-06 ENCOUNTER — Ambulatory Visit (HOSPITAL_COMMUNITY): Payer: Medicaid Other

## 2023-05-06 ENCOUNTER — Other Ambulatory Visit: Payer: Self-pay | Admitting: Psychiatry

## 2023-05-06 DIAGNOSIS — F332 Major depressive disorder, recurrent severe without psychotic features: Secondary | ICD-10-CM

## 2023-05-07 ENCOUNTER — Ambulatory Visit (HOSPITAL_COMMUNITY): Payer: Medicaid Other

## 2023-05-08 ENCOUNTER — Ambulatory Visit (HOSPITAL_COMMUNITY): Payer: Medicaid Other

## 2023-05-08 NOTE — Psych (Signed)
Virtual Visit via Video Note  I connected with Sabrina Palmer on 04/24/23 at  9:00 AM EDT by a video enabled telemedicine application and verified that I am speaking with the correct person using two identifiers.  Location: Patient: patient home Provider: clinical home office   I discussed the limitations of evaluation and management by telemedicine and the availability of in person appointments. The patient expressed understanding and agreed to proceed.  I discussed the assessment and treatment plan with the patient. The patient was provided an opportunity to ask questions and all were answered. The patient agreed with the plan and demonstrated an understanding of the instructions.   The patient was advised to call back or seek an in-person evaluation if the symptoms worsen or if the condition fails to improve as anticipated.  Pt was provided 240 minutes of non-face-to-face time during this encounter.   Donia Guiles, LCSW   [facility] BH PHP THERAPIST PROGRESS NOTE  Sabrina Palmer [id number]  Session Time: 9:00 - 10:00  Participation Level: Active  Behavioral Response: CasualAlertDepressed  Type of Therapy: Group Therapy  Treatment Goals addressed: Coping  Progress Towards Goals: Progressing  Interventions: CBT, DBT, Supportive, and Reframing  Summary: Sabrina Palmer is a 52 y.o. female who presents with depression and grief symptoms.  Clinician led check-in regarding current stressors and situation, and review of patient completed daily inventory. Clinician utilized active listening and empathetic response and validated patient emotions. Clinician facilitated processing group on pertinent issues.?    Summary: Patient arrived within time allowed. Patient rates her mood at a 7 on a scale of 1-10 with 10 being best. Pt states she feels "pretty good." Pt states she slept 8 hours and ate 2x yesterday. Pt states she left the house yesterday and went to the grocery store. Pt states  she spent time outside mowing the lawn. Pt reports sleeping better and feeling refreshed from a shower. Pt shares frustration re: grief being unpredictable. Patient able to process. Patient engaged in discussion.            Session Time: 10:00 am - 11:00 am   Participation Level: Active   Behavioral Response: CasualAlertAnxious and Depressed   Type of Therapy: Group Therapy   Treatment Goals addressed: Coping   Progress Towards Goals: Progressing   Interventions: CBT, DBT, Solution Focused, Strength-based, Supportive, and Reframing   Therapist Response: Cln led discussion on ways to manage stressors and feelings over the weekend. Group members  brainstormed things to do over the weekend for multiple levels of energy, access, and moods. Cln reviewed crisis services should they be needed and provided pt's with the text crisis line, mobile crisis, national suicide hotline, [facility] 24/7 line, and information on [facility].      Therapist Response: Pt engaged in discussion and is able to identify 3 ideas of what to do over the weekend to keep their mind engaged.          Session Time: 11:00 -12:00   Participation Level: Active   Behavioral Response: CasualAlertDepressed   Type of Therapy: Group Therapy   Treatment Goals addressed: Coping   Progress Towards Goals: Progressing   Interventions: CBT, DBT, Solution Focused, Strength-based, Supportive, and Reframing   Summary: Cln led discussion on support groups and the role they can provide in recovery and ongoing treatment. Group discussed success and barriers they have had with past support groups or they have with considering support groups. Cln provided resources for local support groups.  Therapist Response:  Pt engaged in discussion and reports interest in engaging in support groups.          Session Time: 12:00 pm - 1:00 pm   Participation Level: Active   Behavioral Response: CasualAlertAnxious and Depressed    Type of Therapy: Group Therapy   Treatment Goals addressed: Coping   Progress Towards Goals: Progressing   Interventions: CBT, DBT, Solution Focused, Strength-based, Supportive, and Reframing   Therapist Response: 12:00 - 12:50 pm: OT group led by cln E. Hollan 12:50 - 1:00 pm: Clinician led check-out. Clinician assessed for immediate needs, medication compliance and efficacy, and safety concerns?   Summary: 12:00 - 12:50 pm: Pt participated 12:50 - 1:00 pm: At check-out, patient reports no immediate concerns.??Patient demonstrates progress as evidenced by continued engagement and responsiveness to treatment. Patient denies SI/HI/self-harm thoughts at the end of group.     Suicidal/Homicidal: Nowithout intent/plan  Plan: Pt will continue in PHP while working to decrease depression symptoms, improve ability to manage grief, and increase ability to manage symptoms in a healthy manner.   Collaboration of Care: Medication Management AEB J. McQuilla  Patient/Guardian was advised Release of Information must be obtained prior to any record release in order to collaborate their care with an outside provider. Patient/Guardian was advised if they have not already done so to contact the registration department to sign all necessary forms in order for [place] to release information regarding their care.   Consent: Patient/Guardian gives verbal consent for treatment and assignment of benefits for services provided during this visit. Patient/Guardian expressed understanding and agreed to proceed.   Diagnosis: Severe episode of recurrent major depressive disorder, without psychotic features (HCC) [F33.2]    1. Severe episode of recurrent major depressive disorder, without psychotic features (HCC)   2. Bereavement       Donia Guiles, LCSW

## 2023-05-08 NOTE — Psych (Signed)
Virtual Visit via Video Note  I connected with [name] on 04/27/23 at  9:00 AM EDT by a video enabled telemedicine application and verified that I am speaking with the correct person using two identifiers.  Location: Patient: patient home Provider: clinical home office   I discussed the limitations of evaluation and management by telemedicine and the availability of in person appointments. The patient expressed understanding and agreed to proceed.  I discussed the assessment and treatment plan with the patient. The patient was provided an opportunity to ask questions and all were answered. The patient agreed with the plan and demonstrated an understanding of the instructions.   The patient was advised to call back or seek an in-person evaluation if the symptoms worsen or if the condition fails to improve as anticipated.  Pt was provided 240 minutes of non-face-to-face time during this encounter.   Sabrina Guiles, LCSW   [facility] BH PHP THERAPIST PROGRESS NOTE  Sabrina Palmer [id number]  Session Time: 9:00 - 10:00  Participation Level: Active  Behavioral Response: CasualAlertDepressed  Type of Therapy: Group Therapy  Treatment Goals addressed: Coping  Progress Towards Goals: Progressing  Interventions: CBT, DBT, Supportive, and Reframing  Summary: Sabrina Palmer is a 52 y.o. female who presents with depression and grief symptoms.  Clinician led check-in regarding current stressors and situation, and review of patient completed daily inventory. Clinician utilized active listening and empathetic response and validated patient emotions. Clinician facilitated processing group on pertinent issues.?    Summary: Patient arrived within time allowed. Patient rates her mood at a 7 on a scale of 1-10 with 10 being best. Pt states she feels "pretty good." Pt states she slept 7 hours and ate 3x yesterday. Pt shares that she had a second interview on Friday and is waiting to see next  steps. Pt reports feeling unsure of the job and if she is ready for full-time work. Pt states her weekend was "okay" and she had a "me day" on Saturday and went to church in person on Sunday. Pt states it is good for her to be out of the house and around people. Pt reports struggle with faith while navigating grief. Patient able to process. Patient engaged in discussion.            Session Time: 10:00 am - 11:00 am   Participation Level: Active   Behavioral Response: CasualAlertAnxious and Depressed   Type of Therapy: Group Therapy   Treatment Goals addressed: Coping   Progress Towards Goals: Progressing   Interventions: CBT, DBT, Solution Focused, Strength-based, Supportive, and Reframing   Therapist Response: Cln introduced DBT emotion regulation skill, PLEASE. Cln discussed the importance of taking care of our whole body as a way to aid in stabilizing emotions. Group discussed ways they struggle to manage the elements of PLEASE and how to remove barriers.    Therapist Response:  Pt engaged in discussion and is able to identify ways to utilize PLEASE skill.          Session Time: 11:00 -12:00   Participation Level: Active   Behavioral Response: CasualAlertDepressed   Type of Therapy: Group Therapy   Treatment Goals addressed: Coping   Progress Towards Goals: Progressing   Interventions: CBT, DBT, Solution Focused, Strength-based, Supportive, and Reframing   Summary: Cln continued topic of boundaries and led a "boundary workshop" in which group members brought current boundary issues and group worked together to apply boundary concepts to help address the concern. Cln helped shape  conversation to maintain fidelity.    Therapist Response: Pt engaged in discussion and shared a current boundary issue and reports gaining insight.          Session Time: 12:00 pm - 1:00 pm   Participation Level: Active   Behavioral Response: CasualAlertAnxious and Depressed   Type of  Therapy: Group Therapy   Treatment Goals addressed: Coping   Progress Towards Goals: Progressing   Interventions: CBT, DBT, Solution Focused, Strength-based, Supportive, and Reframing   Therapist Response: 12:00 - 12:50 pm: OT group led by cln E. Hollan 12:50 - 1:00 pm: Clinician led check-out. Clinician assessed for immediate needs, medication compliance and efficacy, and safety concerns?   Summary: 12:00 - 12:50 pm: Pt participated 12:50 - 1:00 pm: At check-out, patient reports no immediate concerns.??Patient demonstrates progress as evidenced by continued engagement and responsiveness to treatment. Patient denies SI/HI/self-harm thoughts at the end of group.     Suicidal/Homicidal: Nowithout intent/plan  Plan: Pt will continue in PHP while working to decrease depression symptoms, improve ability to manage grief, and increase ability to manage symptoms in a healthy manner.   Collaboration of Care: Medication Management AEB J. McQuilla  Patient/Guardian was advised Release of Information must be obtained prior to any record release in order to collaborate their care with an outside provider. Patient/Guardian was advised if they have not already done so to contact the registration department to sign all necessary forms in order for [place] to release information regarding their care.   Consent: Patient/Guardian gives verbal consent for treatment and assignment of benefits for services provided during this visit. Patient/Guardian expressed understanding and agreed to proceed.   Diagnosis: Severe episode of recurrent major depressive disorder, without psychotic features (HCC) [F33.2]    1. Severe episode of recurrent major depressive disorder, without psychotic features (HCC)   2. Bereavement   3. Anxiety disorder, unspecified type       Sabrina Guiles, LCSW

## 2023-05-08 NOTE — Psych (Signed)
Virtual Visit via Video Note  I connected with Sabrina Palmer on 04/28/23 at  9:00 AM EDT by a video enabled telemedicine application and verified that I am speaking with the correct person using two identifiers.  Location: Patient: patient home Provider: clinical home office   I discussed the limitations of evaluation and management by telemedicine and the availability of in person appointments. The patient expressed understanding and agreed to proceed.  I discussed the assessment and treatment plan with the patient. The patient was provided an opportunity to ask questions and all were answered. The patient agreed with the plan and demonstrated an understanding of the instructions.   The patient was advised to call back or seek an in-person evaluation if the symptoms worsen or if the condition fails to improve as anticipated.  Pt was provided 240 minutes of non-face-to-face time during this encounter.   Donia Guiles, LCSW   [facility] BH PHP THERAPIST PROGRESS NOTE  Sabrina Palmer [id number]  Session Time: 9:00 - 10:00  Participation Level: Active  Behavioral Response: CasualAlertDepressed  Type of Therapy: Group Therapy  Treatment Goals addressed: Coping  Progress Towards Goals: Progressing  Interventions: CBT, DBT, Supportive, and Reframing  Summary: Sabrina Palmer is a 52 y.o. female who presents with depression and grief symptoms.  Clinician led check-in regarding current stressors and situation, and review of patient completed daily inventory. Clinician utilized active listening and empathetic response and validated patient emotions. Clinician facilitated processing group on pertinent issues.?    Summary: Patient arrived within time allowed. Patient rates her mood at a 2.5 on a scale of 1-10 with 10 being best. Pt states she feels "not good." Pt states she slept 6 hours and ate 3x yesterday. Pt reports she had a "grief attack" last night and felt hopeless, mad, and sad.  Pt states the afternoon went well and she spent time with her sister. Pt states later in the evening her sister called her and mentioned plans she has with her partner for [facility] and it triggered pt's grief spiral. Pt reports struggling with thought that she will never have plans with her husband again. Pt is tearful. Pt reports trying to watch tv until she was able to fall asleep. Patient able to process. Patient engaged in discussion.            Session Time: 10:00 am - 11:00 am   Participation Level: Active   Behavioral Response: CasualAlertAnxious and Depressed   Type of Therapy: Group Therapy   Treatment Goals addressed: Coping   Progress Towards Goals: Progressing   Interventions: CBT, DBT, Solution Focused, Strength-based, Supportive, and Reframing   Therapist Response: Cln led discussion on coping skills that can help in intense feelings. Cln discussed DBT TIP skills of temperature and intense exercise. Cln shared DBT ACCEPTS sensation skill. Group discussed how to apply these skills and how to incorporate them into their coping plans.   Therapist Response:  Pt engaged in discussion and is able to identify ways to practice skills discussed.          Session Time: 11:00 -12:00   Participation Level: Active   Behavioral Response: CasualAlertDepressed   Type of Therapy: Group Therapy   Treatment Goals addressed: Coping   Progress Towards Goals: Progressing   Interventions: CBT, DBT, Solution Focused, Strength-based, Supportive, and Reframing   Summary: Cln introduced the "Thoughts" distraction skills from DBT distress tolerance skill ACCEPTS. Cln discussed how this set of distraction skills can be helpful when in  situations where outside resources are limited or unavailable. Group practiced and brainstormed ways to apply the Thought skill.    Therapist Response: Pt engaged in discussion and is able to determine ways to utilize skill.          Session Time:  12:00 pm - 1:00 pm   Participation Level: Active   Behavioral Response: CasualAlertAnxious and Depressed   Type of Therapy: Group Therapy   Treatment Goals addressed: Coping   Progress Towards Goals: Progressing   Interventions: CBT, DBT, Solution Focused, Strength-based, Supportive, and Reframing   Therapist Response: 12:00 - 12:50 pm: OT group led by cln E. Hollan 12:50 - 1:00 pm: Clinician led check-out. Clinician assessed for immediate needs, medication compliance and efficacy, and safety concerns?   Summary: 12:00 - 12:50 pm: Pt participated 12:50 - 1:00 pm: At check-out, patient reports no immediate concerns.??Patient demonstrates progress as evidenced by continued engagement and responsiveness to treatment. Patient denies SI/HI/self-harm thoughts at the end of group.     Suicidal/Homicidal: Nowithout intent/plan  Plan: Pt will continue in PHP while working to decrease depression symptoms, improve ability to manage grief, and increase ability to manage symptoms in a healthy manner.   Collaboration of Care: Medication Management AEB J. McQuilla  Patient/Guardian was advised Release of Information must be obtained prior to any record release in order to collaborate their care with an outside provider. Patient/Guardian was advised if they have not already done so to contact the registration department to sign all necessary forms in order for [place] to release information regarding their care.   Consent: Patient/Guardian gives verbal consent for treatment and assignment of benefits for services provided during this visit. Patient/Guardian expressed understanding and agreed to proceed.   Diagnosis: Severe episode of recurrent major depressive disorder, without psychotic features (HCC) [F33.2]    1. Severe episode of recurrent major depressive disorder, without psychotic features (HCC)   2. Bereavement       Donia Guiles, LCSW

## 2023-05-08 NOTE — Psych (Signed)
Virtual Visit via Video Note  I connected with [name] on 04/20/23 at  9:00 AM EDT by a video enabled telemedicine application and verified that I am speaking with the correct person using two identifiers.  Location: Patient: patient home Provider: clinical home office   I discussed the limitations of evaluation and management by telemedicine and the availability of in person appointments. The patient expressed understanding and agreed to proceed.  I discussed the assessment and treatment plan with the patient. The patient was provided an opportunity to ask questions and all were answered. The patient agreed with the plan and demonstrated an understanding of the instructions.   The patient was advised to call back or seek an in-person evaluation if the symptoms worsen or if the condition fails to improve as anticipated.  Pt was provided 240 minutes of non-face-to-face time during this encounter.   Donia Guiles, LCSW   [facility] BH PHP THERAPIST PROGRESS NOTE  Sabrina Palmer [id number]  Session Time: 9:00 - 10:00  Participation Level: Active  Behavioral Response: CasualAlertDepressed  Type of Therapy: Group Therapy  Treatment Goals addressed: Coping  Progress Towards Goals: Initial  Interventions: CBT, DBT, Supportive, and Reframing  Summary: Sabrina Palmer is a 52 y.o. female who presents with depression and grief symptoms.  Clinician led check-in regarding current stressors and situation, and review of patient completed daily inventory. Clinician utilized active listening and empathetic response and validated patient emotions. Clinician facilitated processing group on pertinent issues.?    Summary: Patient arrived within time allowed. Patient rates her mood at a 5 on a scale of 1-10 with 10 being best. Pt states she feels "not bad, not good." Pt states she slept 5.5 hours and ate 2x yesterday. Pt reports she had the "worst mother's day" yesterday because this is the  first one since her husband's death and her son is not speaking to her. Pt states she took herself out and shopped and watched tv. Pt acknowledges the distractions were helpful but she was sad the whole day. Pt reports difficulty with being alone with nothing to do the majority of the time and how different it is from her life before her husband passed. Pt is tearful. Patient able to process. Patient engaged in discussion.            Session Time: 10:00 am - 11:00 am   Participation Level: Active   Behavioral Response: CasualAlertAnxious and Depressed   Type of Therapy: Group Therapy   Treatment Goals addressed: Coping   Progress Towards Goals: Progressing   Interventions: CBT, DBT, Solution Focused, Strength-based, Supportive, and Reframing   Therapist Response: Cln introduced DBT emotion regulation skill, Opposite Action. Cln discussed how imagining the opposite reaction and acting in that manner can help keep [place] regulated and limit negative consequences. Group discussed ways they struggle with managing reactions.    Therapist Response: Pt engaged in discussion and is able to identify ways to utilize opposite action.          Session Time: 11:00 -12:00   Participation Level: Active   Behavioral Response: CasualAlertDepressed   Type of Therapy: Group Therapy   Treatment Goals addressed: Coping   Progress Towards Goals: Progressing   Interventions: CBT, DBT, Solution Focused, Strength-based, Supportive, and Reframing   Summary: Cln introduced topic of boundaries. Cln discussed how boundaries inform our relationships and affect self-esteem and personal agency. Group discussed the three types of boundaries: rigid, porous, and healthy and when each type is most  helpful/harmful.    Therapist Response:  Pt engaged in discussion and reports understanding.          Session Time: 12:00 pm - 1:00 pm   Participation Level: Active   Behavioral Response: CasualAlertAnxious and  Depressed   Type of Therapy: Group Therapy   Treatment Goals addressed: Coping   Progress Towards Goals: Progressing   Interventions: CBT, DBT, Solution Focused, Strength-based, Supportive, and Reframing   Therapist Response: 12:00 - 12:50 pm: OT group led by cln E. Hollan 12:50 - 1:00 pm: Clinician led check-out. Clinician assessed for immediate needs, medication compliance and efficacy, and safety concerns?   Summary: 12:00 - 12:50 pm: Pt participated 12:50 - 1:00 pm: At check-out, patient reports no immediate concerns.??Patient demonstrates progress as evidenced by participation in first group session. Patient denies SI/HI/self-harm thoughts at the end of group.     Suicidal/Homicidal: Nowithout intent/plan  Plan: Pt will continue in PHP while working to decrease depression symptoms, improve ability to manage grief, and increase ability to manage symptoms in a healthy manner.   Collaboration of Care: Medication Management AEB J. McQuilla  Patient/Guardian was advised Release of Information must be obtained prior to any record release in order to collaborate their care with an outside provider. Patient/Guardian was advised if they have not already done so to contact the registration department to sign all necessary forms in order for [place] to release information regarding their care.   Consent: Patient/Guardian gives verbal consent for treatment and assignment of benefits for services provided during this visit. Patient/Guardian expressed understanding and agreed to proceed.   Diagnosis: Severe episode of recurrent major depressive disorder, without psychotic features (HCC) [F33.2]    1. Severe episode of recurrent major depressive disorder, without psychotic features (HCC)   2. Bereavement       Donia Guiles, LCSW

## 2023-05-08 NOTE — Psych (Signed)
Virtual Visit via Video Note  I connected with [name] on 04/21/23 at  9:00 AM EDT by a video enabled telemedicine application and verified that I am speaking with the correct person using two identifiers.  Location: Patient: patient home Provider: clinical home office   I discussed the limitations of evaluation and management by telemedicine and the availability of in person appointments. The patient expressed understanding and agreed to proceed.  I discussed the assessment and treatment plan with the patient. The patient was provided an opportunity to ask questions and all were answered. The patient agreed with the plan and demonstrated an understanding of the instructions.   The patient was advised to call back or seek an in-person evaluation if the symptoms worsen or if the condition fails to improve as anticipated.  Pt was provided 240 minutes of non-face-to-face time during this encounter.   Donia Guiles, LCSW   [facility] BH PHP THERAPIST PROGRESS NOTE  DAYLYNN BERGSTRAND [id number]  Session Time: 9:00 - 10:00  Participation Level: Active  Behavioral Response: CasualAlertDepressed  Type of Therapy: Group Therapy  Treatment Goals addressed: Coping  Progress Towards Goals: Initial  Interventions: CBT, DBT, Supportive, and Reframing  Summary: Sabrina Palmer is a 52 y.o. female who presents with depression and grief symptoms.  Clinician led check-in regarding current stressors and situation, and review of patient completed daily inventory. Clinician utilized active listening and empathetic response and validated patient emotions. Clinician facilitated processing group on pertinent issues.?    Summary: Patient arrived within time allowed. Patient rates her mood at a 7 on a scale of 1-10 with 10 being best. Pt states she feels "not bad, not good." Pt states she slept 9+ hours and ate 2x yesterday. Pt reports she thinks her new medication is helpful. Pt states spending time  with her sister yesterday which went well. Pt identifies sister as her primary support. Pt reports struggle with having less support since her husband's death and feeling like everything is a reminder. Patient able to process. Patient engaged in discussion.            Session Time: 10:00 am - 11:00 am   Participation Level: Active   Behavioral Response: CasualAlertAnxious and Depressed   Type of Therapy: Group Therapy   Treatment Goals addressed: Coping   Progress Towards Goals: Progressing   Interventions: CBT, DBT, Solution Focused, Strength-based, Supportive, and Reframing   Therapist Response: Cln led discussion on rest. Cln discussed the need to rewrite social story of rest being earned or last on the list and assert that rest is productive. Group members discussed barriers to allowing themselves rest and the negative self-talk involved.  Cln worked with group to thought challenge and apply self-coaching strategies.   Therapist Response: Pt engaged in discussion and shares the ways in which rest is a struggle.           Session Time: 11:00 -12:00   Participation Level: Active   Behavioral Response: CasualAlertDepressed   Type of Therapy: Group Therapy   Treatment Goals addressed: Coping   Progress Towards Goals: Progressing   Interventions: CBT, DBT, Solution Focused, Strength-based, Supportive, and Reframing   Summary: Cln continued topic of boundaries. Cln discussed the different ways boundaries present: physical, emotional, intellectual, sexual, material, and time. Group talked about the ways in which each type presents for them and is a struggle.    Therapist Response:  Pt engaged in discussion and is able to identify personal problems with each type.  Session Time: 12:00 pm - 1:00 pm   Participation Level: Active   Behavioral Response: CasualAlertAnxious and Depressed   Type of Therapy: Group Therapy   Treatment Goals addressed: Coping    Progress Towards Goals: Progressing   Interventions: CBT, DBT, Solution Focused, Strength-based, Supportive, and Reframing   Therapist Response: 12:00 - 12:50 pm: OT group led by cln E. Hollan 12:50 - 1:00 pm: Clinician led check-out. Clinician assessed for immediate needs, medication compliance and efficacy, and safety concerns?   Summary: 12:00 - 12:50 pm: Pt participated 12:50 - 1:00 pm: At check-out, patient reports no immediate concerns.??Patient demonstrates progress as evidenced by continued engagement and responsiveness to treatment. Patient denies SI/HI/self-harm thoughts at the end of group.     Suicidal/Homicidal: Nowithout intent/plan  Plan: Pt will continue in PHP while working to decrease depression symptoms, improve ability to manage grief, and increase ability to manage symptoms in a healthy manner.   Collaboration of Care: Medication Management A[employer] J. McQuilla  Patient/Guardian was advised Release of Information must be obtained prior to any record release in order to collaborate their care with an outside provider. Patient/Guardian was advised if they have not already done so to contact the registration department to sign all necessary forms in order for [place] to release information regarding their care.   Consent: Patient/Guardian gives verbal consent for treatment and assignment of benefits for services provided during this visit. Patient/Guardian expressed understanding and agreed to proceed.   Diagnosis: Severe episode of recurrent major depressive disorder, without psychotic features (HCC) [F33.2]    1. Severe episode of recurrent major depressive disorder, without psychotic features (HCC)   2. Bereavement       Donia Guiles, LCSW

## 2023-05-08 NOTE — Psych (Signed)
Virtual Visit via Video Note  I connected with [name] on 04/30/23 at  9:00 AM EDT by a video enabled telemedicine application and verified that I am speaking with the correct person using two identifiers.  Location: Patient: patient home Provider: clinical home office   I discussed the limitations of evaluation and management by telemedicine and the availability of in person appointments. The patient expressed understanding and agreed to proceed.  I discussed the assessment and treatment plan with the patient. The patient was provided an opportunity to ask questions and all were answered. The patient agreed with the plan and demonstrated an understanding of the instructions.   The patient was advised to call back or seek an in-person evaluation if the symptoms worsen or if the condition fails to improve as anticipated.  Pt was provided 240 minutes of non-face-to-face time during this encounter.   Sabrina Guiles, LCSW   [facility] BH PHP THERAPIST PROGRESS NOTE  WALESCA PLUMLEE [id number]  Session Time: 9:00 - 10:00  Participation Level: Active  Behavioral Response: CasualAlertDepressed  Type of Therapy: Group Therapy  Treatment Goals addressed: Coping  Progress Towards Goals: Progressing  Interventions: CBT, DBT, Supportive, and Reframing  Summary: [name] is a 52 y.o. female who presents with depression and grief symptoms.  Clinician led check-in regarding current stressors and situation, and review of patient completed daily inventory. Clinician utilized active listening and empathetic response and validated patient emotions. Clinician facilitated processing group on pertinent issues.?    Summary: Patient arrived within time allowed. Patient rates her mood at a 8 on a scale of 1-10 with 10 being best. Pt states she feels "not good." Pt states she slept 9 hours and ate 3x yesterday. Pt reports she went to the grocery store and cooked dinner. Pt states going to the  [place] and walking 30 minutes. Pt reports she watched a meditation to fall asleep and it worked well. Patient able to process. Patient engaged in discussion.            Session Time: 10:00 am - 11:00 am   Participation Level: Active   Behavioral Response: CasualAlertAnxious and Depressed   Type of Therapy: Group Therapy   Treatment Goals addressed: Coping   Progress Towards Goals: Progressing   Interventions: CBT, DBT, Solution Focused, Strength-based, Supportive, and Reframing   Therapist Response:  Cln led discussion on deep breathing and its therapeutic benefits, using DBT TIPP skills to inform discussion. Group practiced how to breathe from their diaphragms to ensure therapeutic quality and different ways to keep track of regulating breaths.    Therapist Response: Pt engaged in discussion and practice.          Session Time: 11:00 -12:00   Participation Level: Active   Behavioral Response: CasualAlertDepressed   Type of Therapy: Group Therapy   Treatment Goals addressed: Coping   Progress Towards Goals: Progressing   Interventions: CBT, DBT, Solution Focused, Strength-based, Supportive, and Reframing   Summary: Cln continued topic of DBT distress tolerance skills. Cln introduced Self-Soothe skills. Group discussed ways they can utilize the five senses to soothe themselves when struggling.   Therapist Response:  Pt engaged in discussion and identifies way to utilize the skill.          Session Time: 12:00 pm - 1:00 pm   Participation Level: Active   Behavioral Response: CasualAlertAnxious and Depressed   Type of Therapy: Group Therapy   Treatment Goals addressed: Coping   Progress Towards Goals: Progressing  Interventions: CBT, DBT, Solution Focused, Strength-based, Supportive, and Reframing   Therapist Response: 12:00 - 12:50 pm: OT group led by cln E. Hollan 12:50 - 1:00 pm: Clinician led check-out. Clinician assessed for immediate needs, medication  compliance and efficacy, and safety concerns?   Summary: 12:00 - 12:50 pm: Pt participated 12:50 - 1:00 pm: At check-out, patient reports no immediate concerns.??Patient demonstrates progress as evidenced by continued engagement and responsiveness to treatment. Patient denies SI/HI/self-harm thoughts at the end of group.     Suicidal/Homicidal: Nowithout intent/plan  Plan: Pt will continue in PHP while working to decrease depression symptoms, improve ability to manage grief, and increase ability to manage symptoms in a healthy manner.   Collaboration of Care: Medication Management A[employer] J. McQuilla  Patient/Guardian was advised Release of Information must be obtained prior to any record release in order to collaborate their care with an outside provider. Patient/Guardian was advised if they have not already done so to contact the registration department to sign all necessary forms in order for [place] to release information regarding their care.   Consent: Patient/Guardian gives verbal consent for treatment and assignment of benefits for services provided during this visit. Patient/Guardian expressed understanding and agreed to proceed.   Diagnosis: Severe episode of recurrent major depressive disorder, without psychotic features (HCC) [F33.2]    1. Severe episode of recurrent major depressive disorder, without psychotic features (HCC)   2. Bereavement       Sabrina Guiles, LCSW

## 2023-05-08 NOTE — Psych (Addendum)
Virtual Visit via Video Note  I connected with Sabrina Palmer on 05/01/23 at  9:00 AM EDT by a video enabled telemedicine application and verified that I am speaking with the correct person using two identifiers.  Location: Patient: patient home Provider: clinical home office   I discussed the limitations of evaluation and management by telemedicine and the availability of in person appointments. The patient expressed understanding and agreed to proceed.  I discussed the assessment and treatment plan with the patient. The patient was provided an opportunity to ask questions and all were answered. The patient agreed with the plan and demonstrated an understanding of the instructions.   The patient was advised to call back or seek an in-person evaluation if the symptoms worsen or if the condition fails to improve as anticipated.  Pt was provided 240 minutes of non-face-to-face time during this encounter.   Donia Guiles, LCSW   [facility] BH PHP THERAPIST PROGRESS NOTE  Sabrina Palmer [id number]  Session Time: 9:00 - 10:00  Participation Level: Active  Behavioral Response: CasualAlertDepressed  Type of Therapy: Group Therapy  Treatment Goals addressed: Coping  Progress Towards Goals: Progressing  Interventions: CBT, DBT, Supportive, and Reframing  Summary: Sabrina Palmer is a 52 y.o. female who presents with depression and grief symptoms.  Clinician led check-in regarding current stressors and situation, and review of patient completed daily inventory. Clinician utilized active listening and empathetic response and validated patient emotions. Clinician facilitated processing group on pertinent issues.?    Summary: Patient arrived within time allowed. Patient rates her mood at a 6 on a scale of 1-10 with 10 being best. Pt states she feels "tired." Pt states she slept 8 hours and ate 3x yesterday. Pt reports some success with establishing a routine and she walked for 30 minutes,  cooked, and talked to her sister. Pt struggles with future oriented anxiety. Patient able to process. Patient engaged in discussion.            Session Time: 10:00 am - 11:00 am   Participation Level: Active   Behavioral Response: CasualAlertAnxious and Depressed   Type of Therapy: Group Therapy   Treatment Goals addressed: Coping   Progress Towards Goals: Progressing   Interventions: CBT, DBT, Solution Focused, Strength-based, Supportive, and Reframing   Therapist Response:  Cln led discussion on anxious behaviors. Group members shared patterns of anxiety they experience. Cln encouraged pt's to increase insight into the roots of their anxious behaviors to be able to address triggers.     Therapist Response: Pt engaged in discussion and reports increased insight into triggers.          Session Time: 11:00 -12:00   Participation Level: Active   Behavioral Response: CasualAlertDepressed   Type of Therapy: Group Therapy   Treatment Goals addressed: Coping   Progress Towards Goals: Progressing   Interventions: CBT, DBT, Solution Focused, Strength-based, Supportive, and Reframing   Summary: Cln led discussion on ways to manage stressors and feelings over the weekend. Group members  brainstormed things to do over the weekend for multiple levels of energy, access, and moods. Cln reviewed crisis services should they be needed and provided pt's with the text crisis line, mobile crisis, national suicide hotline, [facility] 24/7 line, and information on [facility].     Therapist Response:  Pt engaged in discussion and is able to identify 3 ideas of what to do over the weekend to keep their mind engaged.  Session Time: 12:00 pm - 1:00 pm   Participation Level: Active   Behavioral Response: CasualAlertAnxious and Depressed   Type of Therapy: Group Therapy   Treatment Goals addressed: Coping   Progress Towards Goals: Progressing   Interventions: CBT, DBT, Solution  Focused, Strength-based, Supportive, and Reframing   Therapist Response: 12:00 - 12:50 pm: OT group led by cln E. Hollan 12:50 - 1:00 pm: Clinician led check-out. Clinician assessed for immediate needs, medication compliance and efficacy, and safety concerns?   Summary: 12:00 - 12:50 pm: Pt participated 12:50 - 1:00 pm: At check-out, patient reports no immediate concerns.??Patient demonstrates progress as evidenced by continued engagement and responsiveness to treatment. Patient denies SI/HI/self-harm thoughts at the end of group.     Suicidal/Homicidal: Nowithout intent/plan  Plan: Pt will discharge from PHP due to meeting treatment goals of decreased depression symptoms, improved ability to manage grief, and increased ability to manage symptoms in a healthy manner. Pt will step down to outpatient psychiatry and therapy. Pt and provider are aligned with discharge. Pt denies SI/HI at time of discharge.   Collaboration of Care: Medication Management AEB J. McQuilla  Patient/Guardian was advised Release of Information must be obtained prior to any record release in order to collaborate their care with an outside provider. Patient/Guardian was advised if they have not already done so to contact the registration department to sign all necessary forms in order for [place] to release information regarding their care.   Consent: Patient/Guardian gives verbal consent for treatment and assignment of benefits for services provided during this visit. Patient/Guardian expressed understanding and agreed to proceed.   Diagnosis: Severe episode of recurrent major depressive disorder, without psychotic features (HCC) [F33.2]    1. Severe episode of recurrent major depressive disorder, without psychotic features (HCC)   2. Bereavement       Donia Guiles, LCSW

## 2023-05-08 NOTE — Psych (Signed)
Virtual Visit via Video Note  I connected with Sabrina Palmer on 04/22/23 at  9:00 AM EDT by a video enabled telemedicine application and verified that I am speaking with the correct person using two identifiers.  Location: Patient: patient home Provider: clinical home office   I discussed the limitations of evaluation and management by telemedicine and the availability of in person appointments. The patient expressed understanding and agreed to proceed.  I discussed the assessment and treatment plan with the patient. The patient was provided an opportunity to ask questions and all were answered. The patient agreed with the plan and demonstrated an understanding of the instructions.   The patient was advised to call back or seek an in-person evaluation if the symptoms worsen or if the condition fails to improve as anticipated.  Pt was provided 240 minutes of non-face-to-face time during this encounter.   Sabrina Guiles, LCSW   [facility] BH PHP THERAPIST PROGRESS NOTE  Sabrina Palmer [id number]  Session Time: 9:00 - 10:00  Participation Level: Active  Behavioral Response: CasualAlertDepressed  Type of Therapy: Group Therapy  Treatment Goals addressed: Coping  Progress Towards Goals: Initial  Interventions: CBT, DBT, Supportive, and Reframing  Summary: Sabrina Palmer is a 52 y.o. female who presents with depression and grief symptoms.  Clinician led check-in regarding current stressors and situation, and review of patient completed daily inventory. Clinician utilized active listening and empathetic response and validated patient emotions. Clinician facilitated processing group on pertinent issues.?    Summary: Patient arrived within time allowed. Patient rates her mood at a 4 on a scale of 1-10 with 10 being best. Pt states she feels "low." Pt states she slept 4.5 hours and ate 2x yesterday. Pt states she had "funky" dreams that interfered with her sleep. Pt states she got out of  the house yesterday and walked for 25 minutes. Pt reports filling her time feels like a chore and she wishes it didn't. Pt reports difficulty with accepting the changes in her life. Patient able to process. Patient engaged in discussion.            Session Time: 10:00 am - 11:00 am   Participation Level: Active   Behavioral Response: CasualAlertAnxious and Depressed   Type of Therapy: Group Therapy   Treatment Goals addressed: Coping   Progress Towards Goals: Progressing   Interventions: CBT, DBT, Solution Focused, Strength-based, Supportive, and Reframing   Therapist Response: Cln led processing group for pt's current struggles. Group members shared stressors and provided support and feedback. Cln brought in topics of boundaries, healthy relationships, and unhealthy thought processes to inform discussion.    Therapist Response: Pt able to process and provide support to group.          Session Time: 11:00 -12:00   Participation Level: Active   Behavioral Response: CasualAlertDepressed   Type of Therapy: Group Therapy, Spiritual Care   Treatment Goals addressed: Coping   Progress Towards Goals: Progressing   Interventions: Supportive, Education   Summary:  Sabrina Palmer, Chaplain, led group.   Therapist Response: Pt participated         Session Time: 12:00 pm - 1:00 pm   Participation Level: Active   Behavioral Response: CasualAlertAnxious and Depressed   Type of Therapy: Group Therapy   Treatment Goals addressed: Coping   Progress Towards Goals: Progressing   Interventions: CBT, DBT, Solution Focused, Strength-based, Supportive, and Reframing   Therapist Response: 12:00 - 12:50 pm: OT group led by cln E. Hollan  12:50 - 1:00 pm: Clinician led check-out. Clinician assessed for immediate needs, medication compliance and efficacy, and safety concerns?   Summary: 12:00 - 12:50 pm: Pt participated 12:50 - 1:00 pm: At check-out, patient reports no immediate  concerns.??Patient demonstrates progress as evidenced by continued engagement and responsiveness to treatment. Patient denies SI/HI/self-harm thoughts at the end of group.     Suicidal/Homicidal: Nowithout intent/plan  Plan: Pt will continue in PHP while working to decrease depression symptoms, improve ability to manage grief, and increase ability to manage symptoms in a healthy manner.   Collaboration of Care: Medication Management AEB J. McQuilla  Patient/Guardian was advised Release of Information must be obtained prior to any record release in order to collaborate their care with an outside provider. Patient/Guardian was advised if they have not already done so to contact the registration department to sign all necessary forms in order for [place] to release information regarding their care.   Consent: Patient/Guardian gives verbal consent for treatment and assignment of benefits for services provided during this visit. Patient/Guardian expressed understanding and agreed to proceed.   Diagnosis: Severe episode of recurrent major depressive disorder, without psychotic features (HCC) [F33.2]    1. Severe episode of recurrent major depressive disorder, without psychotic features (HCC)   2. Bereavement       Sabrina Guiles, LCSW

## 2023-05-08 NOTE — Psych (Signed)
Virtual Visit via Video Note  I connected with Sabrina Palmer on 04/23/23 at  9:00 AM EDT by a video enabled telemedicine application and verified that I am speaking with the correct person using two identifiers.  Location: Patient: patient home Provider: clinical home office   I discussed the limitations of evaluation and management by telemedicine and the availability of in person appointments. The patient expressed understanding and agreed to proceed.  I discussed the assessment and treatment plan with the patient. The patient was provided an opportunity to ask questions and all were answered. The patient agreed with the plan and demonstrated an understanding of the instructions.   The patient was advised to call back or seek an in-person evaluation if the symptoms worsen or if the condition fails to improve as anticipated.  Pt was provided 240 minutes of non-face-to-face time during this encounter.   Donia Guiles, LCSW   [facility] BH PHP THERAPIST PROGRESS NOTE  Sabrina Palmer [id number]  Session Time: 9:00 - 10:00  Participation Level: Active  Behavioral Response: CasualAlertDepressed  Type of Therapy: Group Therapy  Treatment Goals addressed: Coping  Progress Towards Goals: Initial  Interventions: CBT, DBT, Supportive, and Reframing  Summary: Sabrina Palmer is a 52 y.o. female who presents with depression and grief symptoms.  Clinician led check-in regarding current stressors and situation, and review of patient completed daily inventory. Clinician utilized active listening and empathetic response and validated patient emotions. Clinician facilitated processing group on pertinent issues.?    Summary: Patient arrived within time allowed. Patient rates her mood at a 3 on a scale of 1-10 with 10 being best. Pt states she feels "rough." Pt states she slept 6.5 hours and ate 2x yesterday. Pt states she had "really bad grief" last night. Pt reports she was crying angry, sad,  and "lost." Pt reports being angry that he left her and knowing they won't have the future she envisioned. Patient able to process. Patient engaged in discussion.            Session Time: 10:00 am - 11:00 am   Participation Level: Active   Behavioral Response: CasualAlertAnxious and Depressed   Type of Therapy: Group Therapy   Treatment Goals addressed: Coping   Progress Towards Goals: Progressing   Interventions: CBT, DBT, Solution Focused, Strength-based, Supportive, and Reframing   Therapist Response: Cln led discussion on extending grace and kindness to ourselves. Group discussed the messages they give themselves and how it impacts them. Cln discussed the best friend test as a way to calibrate whether we are being fair to ourselves or not and encouraged pt's to consider, would I believe this/say this about someone I cared about?   Therapist Response: Pt engaged in discussion and shares difficulties with being kind to themself.         Session Time: 11:00 -12:00   Participation Level: Active   Behavioral Response: CasualAlertDepressed   Type of Therapy: Group Therapy   Treatment Goals addressed: Coping   Progress Towards Goals: Progressing   Interventions: CBT, DBT, Solution Focused, Strength-based, Supportive, and Reframing   Summary:  Cln led discussion on protective factors and how they are things within our control that we can work on to improve our health and mental fortitude. Group discussed where they feel like they are with certain protective factors and how they can take a baby step to increase where they are with protective factors.    Therapist Response:  Pt engaged in discussion and is able  to determine action step.         Session Time: 12:00 pm - 1:00 pm   Participation Level: Active   Behavioral Response: CasualAlertAnxious and Depressed   Type of Therapy: Group Therapy   Treatment Goals addressed: Coping   Progress Towards Goals:  Progressing   Interventions: CBT, DBT, Solution Focused, Strength-based, Supportive, and Reframing   Therapist Response: 12:00 - 12:50 pm: OT group led by cln E. Hollan 12:50 - 1:00 pm: Clinician led check-out. Clinician assessed for immediate needs, medication compliance and efficacy, and safety concerns?   Summary: 12:00 - 12:50 pm: Pt participated 12:50 - 1:00 pm: At check-out, patient reports no immediate concerns.??Patient demonstrates progress as evidenced by continued engagement and responsiveness to treatment. Patient denies SI/HI/self-harm thoughts at the end of group.     Suicidal/Homicidal: Nowithout intent/plan  Plan: Pt will continue in PHP while working to decrease depression symptoms, improve ability to manage grief, and increase ability to manage symptoms in a healthy manner.   Collaboration of Care: Medication Management [employer]  Patient/Guardian was advised Release of Information must be obtained prior to any record release in order to collaborate their care with an outside provider. Patient/Guardian was advised if they have not already done so to contact the registration department to sign all necessary forms in order for [place] to release information regarding their care.   Consent: Patient/Guardian gives verbal consent for treatment and assignment of benefits for services provided during this visit. Patient/Guardian expressed understanding and agreed to proceed.   Diagnosis: Severe episode of recurrent major depressive disorder, without psychotic features (HCC) [F33.2]    1. Severe episode of recurrent major depressive disorder, without psychotic features (HCC)   2. Bereavement   3. Anxiety disorder, unspecified type       Donia Guiles, LCSW

## 2023-05-11 ENCOUNTER — Ambulatory Visit (HOSPITAL_COMMUNITY): Payer: Medicaid Other

## 2023-05-12 ENCOUNTER — Ambulatory Visit (HOSPITAL_COMMUNITY): Payer: Medicaid Other

## 2023-05-13 ENCOUNTER — Other Ambulatory Visit (HOSPITAL_COMMUNITY): Payer: Self-pay

## 2023-05-13 ENCOUNTER — Telehealth: Payer: Self-pay

## 2023-05-13 ENCOUNTER — Ambulatory Visit (HOSPITAL_COMMUNITY): Payer: Medicaid Other

## 2023-05-13 NOTE — Telephone Encounter (Signed)
Patient Advocate Encounter   Received notification from [facility] that prior authorization is required for Rybelsus 7mg   Submitted: 05/13/23 Key [id number]  Status is pending

## 2023-05-14 ENCOUNTER — Ambulatory Visit (HOSPITAL_COMMUNITY): Payer: Medicaid Other

## 2023-05-15 ENCOUNTER — Ambulatory Visit (HOSPITAL_COMMUNITY): Payer: Medicaid Other

## 2023-05-15 NOTE — Telephone Encounter (Signed)
Pharmacy Patient Advocate Encounter  Received notification that the request for prior authorization has been denied due to:      

## 2023-05-19 ENCOUNTER — Telehealth: Payer: Self-pay

## 2023-05-19 DIAGNOSIS — Z634 Disappearance and death of family member: Secondary | ICD-10-CM

## 2023-05-19 DIAGNOSIS — F332 Major depressive disorder, recurrent severe without psychotic features: Secondary | ICD-10-CM

## 2023-05-19 NOTE — Telephone Encounter (Signed)
received a request to refill bupropion hcl 75mg .  Pt last seen on 5-6 next appt 6-25

## 2023-05-20 MED ORDER — BUPROPION HCL 75 MG PO TABS
75.0000 mg | ORAL_TABLET | Freq: Every day | ORAL | 1 refills | Status: DC
Start: 2023-05-20 — End: 2023-06-14

## 2023-05-20 NOTE — Telephone Encounter (Signed)
I have sent Wellbutrin 75 mg to [facility].

## 2023-05-21 NOTE — Telephone Encounter (Signed)
Pt was notified.  

## 2023-05-25 ENCOUNTER — Ambulatory Visit (INDEPENDENT_AMBULATORY_CARE_PROVIDER_SITE_OTHER): Payer: Medicaid Other | Admitting: Licensed Clinical Social Worker

## 2023-05-25 DIAGNOSIS — F332 Major depressive disorder, recurrent severe without psychotic features: Secondary | ICD-10-CM

## 2023-05-25 NOTE — Progress Notes (Signed)
Virtual Visit via Video Note  I connected with ALAA IORIO on 05/25/23 at  4:00 PM EDT by a video enabled telemedicine application and verified that I am speaking with the correct person using two identifiers.  Location: Patient: pt's home in [place], [place] Provider: clinical office in [place], [place]   I discussed the limitations of evaluation and management by telemedicine and the availability of in person appointments. The patient expressed understanding and agreed to proceed.   I discussed the assessment and treatment plan with the patient. The patient was provided an opportunity to ask questions and all were answered. The patient agreed with the plan and demonstrated an understanding of the instructions.   The patient was advised to call back or seek an in-person evaluation if the symptoms worsen or if the condition fails to improve as anticipated.  I provided 57 minutes of non-face-to-face time during this encounter.   [name], LCSW    THERAPIST PROGRESS NOTE  Session Time: 57 minutes  Participation Level: Active  Behavioral Response: CasualAlertAnxious and Depressed  Type of Therapy: Individual Therapy  Treatment Goals addressed: establish treatment   ProgressTowards Goals: Initial  Interventions: Supportive  Summary: ASTREA PENT is a 52 y.o. female who presents for initial visit with this cln following PHP discharge. She arrives on time and maintains appropriate eye contact. She states she is okay today, but yesterday she had a very hard time due to it being Father's Day and due to the distance between her and her son. She states she experienced SI last night and drank some Crown [place] instead so that she could calm down. She discusses her feelings about herself and some of the thoughts she has had. She discusses her grief and the issues with her son and daughter-in-law. She processes feelings related to her husband, feeling a lack of purpose, anger  toward her DIL, frustration at not being able to see her son and grandson, and feeling stuck. She expresses some frustration regarding treatment over the past year, as she has taken meds, engaged in therapy, and did PHP. She states she does not believe she has made a lot of progress and expresses, "I just want someone to tell me what to do." She describes her functioning and endorses normal ADLs, shares that she is scheduled for a second job interview on Wednesday, is engaging with her church community, and recently reconnected with one of her cousins. She states she has some days that are good. She expresses possibly wanting to sell her house, though she promised her husband she would not make any big decisions for at least one year after his death, and the one year anniversary is approaching. She denies plan and intent regarding SI, citing her pets, son, and her faith as supportive factors. She denies SI at this time. She is receptive to feedback and requests weekly sessions for the time being.  Suicidal/Homicidal: Nowithout intent/plan  Therapist Response: Cln assessed for current stressors, symptoms, and safety since d/c from PHP. Cln utilized active listening and validation to assist with processing. Cln assessed for plan and intent regarding SI and confirmed protective factors. Cln discussed what therapy can provide and pointed out the added stressor of the discord with her son and DIL and not being able to see her grandson complicates the grief. Cln also pointed out that being laid off from her job and living in the same home can also be contributing factors. Cln assessed functioning and informed pt that she  is functioning well even though she is in pain and actively grieving. Cln requested pt think about tx goals to discuss in the next session. Cln scheduled follow-up appointment and confirmed pt's availability and preferred method of service delivery (virtual).  Plan: Return again in 1  weeks.  Diagnosis: Severe episode of recurrent major depressive disorder, without psychotic features (HCC)  Collaboration of Care: Other none required for this visit.  Patient/Guardian was advised Release of Information must be obtained prior to any record release in order to collaborate their care with an outside provider. Patient/Guardian was advised if they have not already done so to contact the registration department to sign all necessary forms in order for [place] to release information regarding their care.   Consent: Patient/Guardian gives verbal consent for treatment and assignment of benefits for services provided during this visit. Patient/Guardian expressed understanding and agreed to proceed.   [name], LCSW 05/25/2023

## 2023-05-28 ENCOUNTER — Telehealth: Payer: Self-pay | Admitting: Psychiatry

## 2023-05-28 NOTE — Telephone Encounter (Signed)
I spoke with pt over the phone. She reports that she has come to the partial hospitalization program and currently taking Cymbalta 60 mg twice daily, Wellbutrin SR 75 mg once daily and Seroquel 50 mg at night.  Chart review suggests that patient was supposed to take trazodone instead of Seroquel however patient says that she has not taken trazodone and she has been doing well with Seroquel with sleep.  Discussed to continue with Cymbalta 60 mg twice daily, Wellbutrin SR 75 mg daily and Seroquel 50 mg at night for now and discuss regarding changing Seroquel to trazodone at her next appointment with Dr. Elna Breslow.  She verbalized understanding and agreed with this.

## 2023-05-28 NOTE — Telephone Encounter (Signed)
While rescheduling patient appointment she has concerns regarding her medication. Unsure of what to take since being in the [facility] program. She is confused. Please advise

## 2023-06-03 ENCOUNTER — Ambulatory Visit (INDEPENDENT_AMBULATORY_CARE_PROVIDER_SITE_OTHER): Payer: Medicaid Other | Admitting: Licensed Clinical Social Worker

## 2023-06-03 ENCOUNTER — Encounter (HOSPITAL_COMMUNITY): Payer: Self-pay

## 2023-06-03 DIAGNOSIS — F332 Major depressive disorder, recurrent severe without psychotic features: Secondary | ICD-10-CM

## 2023-06-03 NOTE — Progress Notes (Signed)
Virtual Visit via Video Note  I connected with JAMILYNN WHITACRE on 06/03/23 at  2:00 PM EDT by a video enabled telemedicine application and verified that I am speaking with the correct person using two identifiers.  Location: Patient: pt's home in [place], [place] Provider: clinical home office in [place], [place]   I discussed the limitations of evaluation and management by telemedicine and the availability of in person appointments. The patient expressed understanding and agreed to proceed.   I discussed the assessment and treatment plan with the patient. The patient was provided an opportunity to ask questions and all were answered. The patient agreed with the plan and demonstrated an understanding of the instructions.   The patient was advised to call back or seek an in-person evaluation if the symptoms worsen or if the condition fails to improve as anticipated.  I provided 60 minutes of non-face-to-face time during this encounter.   [name], LCSW    THERAPIST PROGRESS NOTE  Session Time: 60 minutes  Participation Level: Active  Behavioral Response: Casual and Well GroomedAlertAnxious and Dysphoric  Type of Therapy: Individual Therapy  Treatment Goals addressed: establish tx goals  ProgressTowards Goals: Initial  Interventions: CBT  Summary: NASHALIE SALLIS is a 52 y.o. female who presents for f/u with this cln. She arrives on time and maintains appropriate eye contact throughout the session. She reports she was turned down for the job she interviewed for and reports she is really disappointed. She asserts that they likely hired someone younger. She also reports the water pressure at her house went out and she will have to spend $1200 to $1500 to fix it. She also states she just had her birthday, which she was not looking forward to. She reports she continues to struggle with feelings of grief, loneliness, lack of purpose, and wanting to die. She denies SI with plan and  intent and states she wishes she were dead. Her speech is somewhat pressured and her organization is circumstantial throughout. Cln made note of this and asks if she has racing thoughts and pt states it seems that way sometimes. She processes feelings regarding her son, stating that he doesn't need her, doesn't care about her, and that it would have been better if she had died instead of her husband. When asked about treatment goals in therapy, she responds, "I want to get out of this dark hole. Fear of panic attacks. Stabilize my emotions, learn how to be more even-keeled." She verbalizes agreement that she is functioning better than she realizes and tells cln about her 10yo neighbor that likes pt's pet bird and comes over sometimes. She also talks about how she has considered becoming a foster parent but is unsure if she can do it. She states she is going to go to [employer] to see about joining a group in order to increase socialization. She is receptive to feedback from cln.   Suicidal/Homicidal: Nowithout intent/plan  Therapist Response: Cln assessed for current stressors, symptoms, and safety since last session. Cln asked pt to identify goals for therapy. Cln utilized active listening and validation to help pt feel heard and understood, express thoughts and feelings, and gain insight into their experiences. Cln identified negative thought patterns when pt expressed them and reframed when appropriate. Cln encouraged pt to practice self-compassion and suggested she start journaling to get her thoughts out, as she expressed she has difficulty turning her mind off. Cln recommended pt create a weekly activity schedule so as not  to overwhelm herself while ensuring she fills her time. Lastly, cln recommended pt explore [email or web address] as a means of increasing socialization. Cln scheduled follow-up appointment and confirmed pt's availability and preferred method of service delivery (virtual).   Plan: Return  again in 1 weeks.  Diagnosis: Severe episode of recurrent major depressive disorder, without psychotic features (HCC)  Collaboration of Care: Other none required for this visit  Patient/Guardian was advised Release of Information must be obtained prior to any record release in order to collaborate their care with an outside provider. Patient/Guardian was advised if they have not already done so to contact the registration department to sign all necessary forms in order for [place] to release information regarding their care.   Consent: Patient/Guardian gives verbal consent for treatment and assignment of benefits for services provided during this visit. Patient/Guardian expressed understanding and agreed to proceed.   [name], LCSW 06/03/2023

## 2023-06-05 ENCOUNTER — Ambulatory Visit: Payer: Medicaid Other | Admitting: Psychiatry

## 2023-06-09 ENCOUNTER — Ambulatory Visit (INDEPENDENT_AMBULATORY_CARE_PROVIDER_SITE_OTHER): Payer: Medicaid Other | Admitting: Licensed Clinical Social Worker

## 2023-06-09 DIAGNOSIS — F332 Major depressive disorder, recurrent severe without psychotic features: Secondary | ICD-10-CM

## 2023-06-09 NOTE — Progress Notes (Signed)
Virtual Visit via Video Note  I connected with Sabrina Palmer on 06/09/23 at 12:00 PM EDT by a video enabled telemedicine application and verified that I am speaking with the correct person using two identifiers.  Location: Patient: pt's home in [place], [place] Provider: clinical home office in [place], [place]   I discussed the limitations of evaluation and management by telemedicine and the availability of in person appointments. The patient expressed understanding and agreed to proceed.   I discussed the assessment and treatment plan with the patient. The patient was provided an opportunity to ask questions and all were answered. The patient agreed with the plan and demonstrated an understanding of the instructions.   The patient was advised to call back or seek an in-person evaluation if the symptoms worsen or if the condition fails to improve as anticipated.  I provided 59 minutes of non-face-to-face time during this encounter.   [name], LCSW    THERAPIST PROGRESS NOTE  Session Time: 59 minutes  Participation Level: Active  Behavioral Response: CasualAlertAnxious and Mildly Irritable  Type of Therapy: Individual Therapy  Treatment Goals addressed: depression, cognitive distortions  ProgressTowards Goals: Progressing  Interventions: CBT  Summary: Sabrina Palmer is a 52 y.o. female who presents for f/u with this cln. She arrives on time and maintains appropriate eye contact throughout the session. She reports she had a busy week and busy morning. She reports she sold one of her cars, the last one she and her husband bought together. She states it felt a little sad to sell it, but that it felt good. She also reports she was proud to sell it by herself. She reports she sold it to grad students from [place] and they told her about Sabrina Palmer, which pt states is on her bucket list to visit. She states they were kind and easy to work with and she filled up the gas tank  for them. She states she likes being busy because it distracts her. She states she went to church and got information for potential groups to join. She also reports she has been working on creating a schedule. Her pet bird, Sabrina Palmer, is present and pt shares how Sabrina Palmer is a protective factor and shares facts about her. The subject of her brother comes up and pt reports he is a "functional alcoholic" and he and their other sister attempted to steal from their mother's estate. She states she does not speak to her brother often and doesn't speak to her oldest sister at all. She states she and her other sister are not currently speaking because her sister is angry with her. She states she and her sister have had several fights like this. She details how she cut off her mother, as her mother was cold and said hurtful things to her. She reports there was a lot of resentment between her and her siblings as well. She states her father held everyone together, and he died in 22-Jun-2007. She states her sister, K, last texted her to leave her alone when pt tried to make up with her. "She thinks I owe her an apology." She states that when she and her sister went to the beach together, she and her sister were arguing at a restaurant and her sister called her a bitch in public. She states her sister is rude, inflexible, and wants everything her way. She then states that she notices she keeps fighting with family members and states maybe it's her. She is somewhat  resistant to feedback, though she acknowledges area for improvement. She states, "Maybe I like being alone. That's fine for right now."  Suicidal/Homicidal: Nowithout intent/plan  Therapist Response: Cln assessed for current stressors, symptoms, and safety since last session. Cln utilized active listening and validation to help pt feel heard and understood, express thoughts and feelings, and gain insight into their experiences. Cln facilitated discussion regarding family dynamics  and explored potential conflict resolution with her sister K. Cln pointed out that pt has verbalized a pattern of assuming others' feelings and intentions and reacts strongly based on those assumptions. Cln encouraged pt to consider cultivating empathy for her sister, daughter-in-law, and others with whom she has had significant conflict. Cln posed the question: would you rather be right, or would you rather be happy? Cln emphasized that it is her choice and verbalized her self-determination. Cln scheduled follow-up appointment and confirmed pt's availability and preferred method of service delivery (virtual).  Plan: Return again in 1 weeks.  Diagnosis: Severe episode of recurrent major depressive disorder, without psychotic features (HCC)  Collaboration of Care: Other none required for this visit  Patient/Guardian was advised Release of Information must be obtained prior to any record release in order to collaborate their care with an outside provider. Patient/Guardian was advised if they have not already done so to contact the registration department to sign all necessary forms in order for [place] to release information regarding their care.   Consent: Patient/Guardian gives verbal consent for treatment and assignment of benefits for services provided during this visit. Patient/Guardian expressed understanding and agreed to proceed.   [name], LCSW 06/09/2023

## 2023-06-09 NOTE — Telephone Encounter (Signed)
Noted  

## 2023-06-10 ENCOUNTER — Ambulatory Visit (HOSPITAL_COMMUNITY): Payer: Medicaid Other | Admitting: Licensed Clinical Social Worker

## 2023-06-12 ENCOUNTER — Other Ambulatory Visit: Payer: Self-pay | Admitting: Psychiatry

## 2023-06-12 DIAGNOSIS — F332 Major depressive disorder, recurrent severe without psychotic features: Secondary | ICD-10-CM

## 2023-06-12 DIAGNOSIS — Z634 Disappearance and death of family member: Secondary | ICD-10-CM

## 2023-06-18 ENCOUNTER — Ambulatory Visit (INDEPENDENT_AMBULATORY_CARE_PROVIDER_SITE_OTHER): Payer: Medicaid Other | Admitting: Licensed Clinical Social Worker

## 2023-06-18 DIAGNOSIS — F332 Major depressive disorder, recurrent severe without psychotic features: Secondary | ICD-10-CM

## 2023-06-18 NOTE — Progress Notes (Signed)
Virtual Visit via Video Note  I connected with Sabrina Palmer on 06/18/23 at  2:00 PM EDT by a video enabled telemedicine application and verified that I am speaking with the correct person using two identifiers.  Location: Patient: pt's home in [place], [place] Provider: clinical home office in [place], [place]   I discussed the limitations of evaluation and management by telemedicine and the availability of in person appointments. The patient expressed understanding and agreed to proceed.   I discussed the assessment and treatment plan with the patient. The patient was provided an opportunity to ask questions and all were answered. The patient agreed with the plan and demonstrated an understanding of the instructions.   The patient was advised to call back or seek an in-person evaluation if the symptoms worsen or if the condition fails to improve as anticipated.  I provided 60 minutes of non-face-to-face time during this encounter.   [name], LCSW    THERAPIST PROGRESS NOTE  Session Time: 60 minutes  Participation Level: Active  Behavioral Response: CasualAlertDepressed  Type of Therapy: Individual Therapy  Treatment Goals addressed: grief  ProgressTowards Goals: Progressing  Interventions: CBT  Summary: Sabrina Palmer is a 52 y.o. female who presents for f/u with this cln. She arrives on time and maintains appropriate eye contact throughout the session. She is tearful upon joining, stating the first anniversary of her husband's death is approaching on 07-21-2023 and she has been looking at pictures of him as he was dying. She states she knows she doesn't have to look at them and that doing so may be unhealthy. She then talks about how she started to feel bad about herself after the last session, stating, "I thought I was a good person but I keep thinking about what you said about how maybe I'm the problem." She is receptive to feedback and clarification from cln. She  reiterates that she needs to get used to being alone and that she has her bird and her cats. When asked how she wants to spend the anniversary, she says she wants to spend it home alone because she doesn't want to cry in public in front of others. She relates her feelings to a show on Netflix called Afterlife. She reports she has had some panic attacks recently in which she cannot stop crying until she takes a Xanax. She endorses worry regarding her son's mental health and discloses history of a previous hospitalization when he was 17. She shares that she did not do an open casket funeral for her husband, but a celebration of life with close family. She states she wonders if she honored him enough even though he said he didn't care what they did. She states she may spread some of his ashes. She continues to endorse passive death thoughts but denies SI with plan and intent. She reports experiencing death anxiety and states, "I know I'm not gonna kill myself because I'm too scared that something is gonna happen to me and no one is gonna find me." She is receptive to feedback and takes notes during session.  Suicidal/Homicidal: Nowithout intent/plan  Therapist Response: Cln assessed for current stressors, symptoms, and safety since last session. Cln utilized active listening and validation to help pt feel heard and understood, express thoughts and feelings, and gain insight into their experiences. Cln clarified that during the previous session, cln attempted to relay that in conflict, both parties contribute and each of [place] can only control our own actions. Regarding the  conflict with her DIL, cln invited pt to think about how she would feel if the situation were reversed, which could help her formulate a more sincere apology if the opportunity arises. Cln provided psyhoeducation regarding panic and the amygdala and informed her that best practice, when she is ready, is to accept feelings of anxiety and panic and to  allow relief to come naturally rather than medicating. Cln encouraged pt to take her medication when she needs it and normalized the increased stress she is currently experiencing, while stating if panic attacks and severe anxiety continue, it may be worth trying to ride out anxiety without Xanax if she feels ready. Cln asked pt how she would like to spend the anniversary and encouraged her to do something meaningful to her to honor him. Cln normalized death anxiety while grieving and anxiety surrounding her son's mental health and encouraged her to focus on what she can control. Cln scheduled follow-up appointment and confirmed pt's availability and preferred method of service delivery (virtual).  Plan: Return again in 1 weeks.  Diagnosis: Severe episode of recurrent major depressive disorder, without psychotic features (HCC)  Collaboration of Care: Other none required for this visit  Patient/Guardian was advised Release of Information must be obtained prior to any record release in order to collaborate their care with an outside provider. Patient/Guardian was advised if they have not already done so to contact the registration department to sign all necessary forms in order for [place] to release information regarding their care.   Consent: Patient/Guardian gives verbal consent for treatment and assignment of benefits for services provided during this visit. Patient/Guardian expressed understanding and agreed to proceed.   [name], LCSW 06/18/2023

## 2023-06-23 ENCOUNTER — Telehealth: Payer: Self-pay

## 2023-06-23 ENCOUNTER — Ambulatory Visit: Payer: Medicaid Other | Admitting: Internal Medicine

## 2023-06-23 ENCOUNTER — Encounter: Payer: Self-pay | Admitting: Internal Medicine

## 2023-06-23 VITALS — BP 126/70 | HR 78 | Ht 67.0 in | Wt 238.8 lb

## 2023-06-23 DIAGNOSIS — E119 Type 2 diabetes mellitus without complications: Secondary | ICD-10-CM

## 2023-06-23 DIAGNOSIS — Z794 Long term (current) use of insulin: Secondary | ICD-10-CM

## 2023-06-23 DIAGNOSIS — E1165 Type 2 diabetes mellitus with hyperglycemia: Secondary | ICD-10-CM

## 2023-06-23 DIAGNOSIS — E0500 Thyrotoxicosis with diffuse goiter without thyrotoxic crisis or storm: Secondary | ICD-10-CM

## 2023-06-23 DIAGNOSIS — Z7984 Long term (current) use of oral hypoglycemic drugs: Secondary | ICD-10-CM | POA: Diagnosis not present

## 2023-06-23 LAB — T3, FREE: T3, Free: 3.7 pg/mL (ref 2.3–4.2)

## 2023-06-23 LAB — TSH: TSH: 3.38 u[IU]/mL (ref 0.35–5.50)

## 2023-06-23 LAB — T4, FREE: Free T4: 0.71 ng/dL (ref 0.60–1.60)

## 2023-06-23 LAB — HEMOGLOBIN A1C: Hemoglobin A1C: 5.7

## 2023-06-23 MED ORDER — SEMAGLUTIDE(0.25 OR 0.5MG/DOS) 2 MG/3ML ~~LOC~~ SOPN
0.5000 mg | PEN_INJECTOR | SUBCUTANEOUS | 3 refills | Status: DC
Start: 2023-06-23 — End: 2023-12-24

## 2023-06-23 MED ORDER — METHIMAZOLE 5 MG PO TABS: ORAL_TABLET | ORAL | 3 refills | Status: DC

## 2023-06-23 MED ORDER — METFORMIN HCL ER 500 MG PO TB24: 500.0000 mg | ORAL_TABLET | Freq: Two times a day (BID) | ORAL | Status: DC

## 2023-06-23 NOTE — Patient Instructions (Addendum)
Please continue: - Metformin ER 500 mg 2x a day with meals. - Rybelsus 7 mg before breakfast  After you finish Rybelsus, try to start Ozempic 0.25 mg weekly. If the sugars are elevaed after this, you may increase the dose to 0.5.  Continue Methimazole 5 mg daily.  Please stop at the lab.  Please come back for a follow-up appointment in 6 months.

## 2023-06-23 NOTE — Telephone Encounter (Signed)
PA needed for Ozempic
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
## 2023-06-23 NOTE — Telephone Encounter (Signed)
Sending to the Rx Prior [employer]

## 2023-06-24 ENCOUNTER — Telehealth: Payer: Self-pay

## 2023-06-24 ENCOUNTER — Ambulatory Visit (INDEPENDENT_AMBULATORY_CARE_PROVIDER_SITE_OTHER): Payer: Medicaid Other | Admitting: Licensed Clinical Social Worker

## 2023-06-24 ENCOUNTER — Other Ambulatory Visit (HOSPITAL_COMMUNITY): Payer: Self-pay

## 2023-06-24 DIAGNOSIS — F332 Major depressive disorder, recurrent severe without psychotic features: Secondary | ICD-10-CM

## 2023-06-24 NOTE — Progress Notes (Signed)
Virtual Visit via Video Note  I connected with Sabrina Palmer on 06/24/23 at 11:00 AM EDT by a video enabled telemedicine application and verified that I am speaking with the correct person using two identifiers.  Location: Patient: Sabrina Palmer's home in [place], [place] Provider: clinical office in [place], [place]   I discussed the limitations of evaluation and management by telemedicine and the availability of in person appointments. The patient expressed understanding and agreed to proceed.  I discussed the assessment and treatment plan with the patient. The patient was provided an opportunity to ask questions and all were answered. The patient agreed with the plan and demonstrated an understanding of the instructions.   The patient was advised to call back or seek an in-person evaluation if the symptoms worsen or if the condition fails to improve as anticipated.  I provided 60 minutes of non-face-to-face time during this encounter.   [name], LCSW    THERAPIST PROGRESS NOTE  Session Time: 60 minutes  Participation Level: Active  Behavioral Response: CasualAlertAnxious, Depressed, and Irritable  Type of Therapy: Individual Therapy  Treatment Goals addressed: depression  ProgressTowards Goals: Progressing  Interventions: CBT and Supportive  Summary: Sabrina Palmer is a 52 y.o. female who presents for f/u with this cln. She arrives on time and maintains appropriate eye contact throughout the session. She reports she drove to her son's house last night after midnight. She states she didn't knock on the door or give any indication that she was there, but just wanted to see the house and see that they were there. She reports on her way home, she started yelling to get out her feelings, which she states made her feel good. She reports she had not taken any sedative meds, so she was safe. "It solved nothing but I was closer to my grandson for a few minutes." She expresses anger  toward her daughter-in-law. When prompted, she shares a memory from when she was in labor and her husband fainted when she was given an epidural. "Him passing out is a memory we laugh about." She shows cln some family pictures and describes her wedding and honeymoon. She then shares some memories of their teenage years and how they met. She states, as she has in previous sessions, that she feels like she is waiting to die. She denies SI. She continues to report feeling lack of purpose. When asked what her purpose was prior to her husband's death, she states it was being a wife and mother and she and her husband we going to travel and enjoy retirement. She perseverates about not being allowed to see her grandson and blaming her DIL. She shares that her DIL asked her not to kiss her grandson for the first six months of his life and Sabrina Palmer forgot and kissed him, and her DIL wiped his forehead off. Sabrina Palmer describes her DIL as paranoid, crazy, and "a bitch." She becomes agitated when cln discusses her DIL's point regarding overstepping boundaries and dangerous viruses being potentially transmitted through saliva. Sabrina Palmer becomes argumentative and states "Well I'm not gonna feel sorry for her and if you're telling me that I need to, then maybe I don't want to talk to you." Sabrina Palmer receptive to redirection and states she will never forgive or accept her DIL and thus needs to work on letting go of her anger and accepting the current situation. She states she understands cln's challenging and expresses appreciation. She also agrees that she takes things personally and states "I  like that you hold me accountable."  Suicidal/Homicidal: Nowithout intent/plan  Therapist Response: Cln assessed for current stressors, symptoms, and safety since last session. Cln utilized active listening and validation to help Sabrina Palmer feel heard and understood, express thoughts and feelings, and gain insight into their experiences. Cln asked Sabrina Palmer to share memories of  her husband when they got married and when they met. Cln suggested Sabrina Palmer turn her focus to herself and her purpose being to find joy. When Sabrina Palmer mentioned fostering a child at some point, cln supported that goal and emphasized the importance of putting herself first now so that she will be ready to be a foster parent someday. Cln attempted to foster empathy with Sabrina Palmer's DIL by pointing out the legitimacy of her DIL's request and that all of [place] become upset when we set boundaries that aren't respected. When Sabrina Palmer became agitated, cln clarified intention and pointed out that Sabrina Palmer has demonstrated a pattern of becoming defensive and taking things personally and has indicated this is a common theme in her other relationships, to which Sabrina Palmer agreed. Cln assessed Sabrina Palmer's openness to feedback and capacity for self-reflection, both of which are present but under-developed at this time but seem to be improving gradually. When Sabrina Palmer clearly stated she does not want to develop empathy or improve her relationship with her DIL, cln discussed the unhealthy rumination Sabrina Palmer has demonstrated regarding the situation and encouraged Sabrina Palmer to work toward acceptance and letting go of anger. Cln acknowledged the difficulty of the therapeutic process and praised Sabrina Palmer's efforts. Cln scheduled follow-up appointment and confirmed Sabrina Palmer's availability and preferred method of service delivery (virtual).  Plan: Return again in 1 weeks.  Diagnosis: Severe episode of recurrent major depressive disorder, without psychotic features (HCC)  Collaboration of Care: Other none required for this visit  Patient/Guardian was advised Release of Information must be obtained prior to any record release in order to collaborate their care with an outside provider. Patient/Guardian was advised if they have not already done so to contact the registration department to sign all necessary forms in order for [place] to release information regarding their care.   Consent: Patient/Guardian gives  verbal consent for treatment and assignment of benefits for services provided during this visit. Patient/Guardian expressed understanding and agreed to proceed.   [name], LCSW 06/24/2023

## 2023-06-24 NOTE — Telephone Encounter (Signed)
Pharmacy Patient Advocate Encounter   Received notification from Pt Calls Messages that prior authorization for Ozempic is required/requested.   Insurance verification completed.   The patient is insured through [facility] .    PA submitted to [facility] via CoverMyMeds Key/confirmation #/EOC [id number] Status is pending  Prior Authorization has been APPROVED from 06/24/23 to 06/23/24.Marland Kitchen

## 2023-06-29 ENCOUNTER — Ambulatory Visit (HOSPITAL_COMMUNITY): Payer: Medicaid Other | Admitting: Licensed Clinical Social Worker

## 2023-06-29 DIAGNOSIS — F411 Generalized anxiety disorder: Secondary | ICD-10-CM | POA: Diagnosis not present

## 2023-06-29 NOTE — Progress Notes (Signed)
Virtual Visit via Video Note  I connected with Sabrina Palmer on 06/29/23 at  3:00 PM EDT by a video enabled telemedicine application and verified that I am speaking with the correct person using two identifiers.  Location: Patient: pt's home in [place], [place] Provider: clinical office in [place], [place]   I discussed the limitations of evaluation and management by telemedicine and the availability of in person appointments. The patient expressed understanding and agreed to proceed.  I discussed the assessment and treatment plan with the patient. The patient was provided an opportunity to ask questions and all were answered. The patient agreed with the plan and demonstrated an understanding of the instructions.   The patient was advised to call back or seek an in-person evaluation if the symptoms worsen or if the condition fails to improve as anticipated.  I provided 57 minutes of non-face-to-face time during this encounter.   [name], LCSW    THERAPIST PROGRESS NOTE  Session Time: 57 minutes  Participation Level: Active  Behavioral Response: CasualAlertAnxious  Type of Therapy: Individual Therapy  Treatment Goals addressed: anxiety  ProgressTowards Goals: Progressing  Interventions: CBT  Summary: Sabrina Palmer is a 52 y.o. female who presents for f/u with this cln. She arrives on time and maintains appropriate eye contact throughout the session. She states today is a good day today and the past week has been okay. She reports she had lunch with her cousin over the weekend and spent some time with some women from church. She states she went for a long walk today with her bird. She reports she is almost finished reading the book "Building a non-anxious life." She shares what from the book speaks to her. She discusses how the book educated her on the different types of trauma and she shares the trauma she has experienced and how those have contributed to her anxiety.  She discusses feeling disheartened regarding job prospects, but states she is financially stable. She expresses interest in applying to be a Lawyer at a private or [address] school, or potentially learning about becoming a Customer service manager. She expresses disappointment that her son did not reach out to her last week for the anniversary of her husband's death. She reports one of her neighbors left her a card in her mailbox last week for the anniversary, which pt appreciated. She reports she worries that the second year will be as difficult as the first. She processes anxiety regarding current political atmosphere and is receptive to feedback.   Suicidal/Homicidal: Nowithout intent/plan  Therapist Response: Cln assessed for current stressors, symptoms, and safety since last session. Cln utilized active listening and validation to help pt feel heard and understood, express thoughts and feelings, and gain insight into their experiences. Cln discussed the necessity of focusing on the here and now and on what we can control. Cln commiserated regarding current events and the stress that comes with them. Cln scheduled follow-up appointment and confirmed pt's availability and preferred method of service delivery (virtual).  Plan: Return again in 1 weeks.  Diagnosis: GAD (generalized anxiety disorder)  Collaboration of Care: Other none required for this visit  Patient/Guardian was advised Release of Information must be obtained prior to any record release in order to collaborate their care with an outside provider. Patient/Guardian was advised if they have not already done so to contact the registration department to sign all necessary forms in order for [place] to release information regarding their care.   Consent: Patient/Guardian  gives verbal consent for treatment and assignment of benefits for services provided during this visit. Patient/Guardian expressed understanding and agreed to proceed.    [name], LCSW 06/29/2023

## 2023-07-08 ENCOUNTER — Ambulatory Visit (HOSPITAL_COMMUNITY): Payer: Medicaid Other | Admitting: Licensed Clinical Social Worker

## 2023-07-09 ENCOUNTER — Ambulatory Visit (INDEPENDENT_AMBULATORY_CARE_PROVIDER_SITE_OTHER): Payer: Medicaid Other | Admitting: Licensed Clinical Social Worker

## 2023-07-09 DIAGNOSIS — F3341 Major depressive disorder, recurrent, in partial remission: Secondary | ICD-10-CM | POA: Insufficient documentation

## 2023-07-09 DIAGNOSIS — F411 Generalized anxiety disorder: Secondary | ICD-10-CM

## 2023-07-09 NOTE — Progress Notes (Signed)
Virtual Visit via Video Note  I connected with [name] on 07/09/23 at  3:00 PM EDT by a video enabled telemedicine application and verified that I am speaking with the correct person using two identifiers.  Location: Patient: pt's home in [place], [place] Provider: clinical home office in [place], [place]   I discussed the limitations of evaluation and management by telemedicine and the availability of in person appointments. The patient expressed understanding and agreed to proceed.    I discussed the assessment and treatment plan with the patient. The patient was provided an opportunity to ask questions and all were answered. The patient agreed with the plan and demonstrated an understanding of the instructions.   The patient was advised to call back or seek an in-person evaluation if the symptoms worsen or if the condition fails to improve as anticipated.  I provided 57 minutes of non-face-to-face time during this encounter.   [name], LCSW    THERAPIST PROGRESS NOTE  Session Time: 57 minutes  Participation Level: Active  Behavioral Response: CasualAlertAnxious and Depressed  Type of Therapy: Individual Therapy  Treatment Goals addressed: coping  ProgressTowards Goals: Progressing  Interventions: CBT  Summary: Sabrina Palmer is a 52 y.o. female who presents for f/u with this cln. She arrives on time and maintains appropriate eye contact throughout the session. She reports she has had a rough couple of days and has had increased anxiety and had a panic attack yesterday. She reports she went to two farms yesterday for peaches and tomatoes. She shares that she missed her medicine yesterday morning and didn't take it until the afternoon and wonders if that contributed. She states she made a peach cobbler for the first time, that turned out very well, and she felt sad that she didn't have anyone to share it with. She states she "got into that deep dark hole of  nobody loves me." She reports she was able to cope by looking at materials from [facility] and remembering that feelings are temporary and panic attacks will end in 15 minutes if you do nothing. She states she almost called [phone] Tuesday night and continued to feel depressed last night. She states she had trouble sleeping as well because of the [employer] outage. She states she still has trouble finding joy in things and is struggling with negative thoughts. She expresses disappointment that she isn't feeling better and worries that she has regressed. She states she has also been thinking about her son, and she wants to give him the money from selling her husband's truck. She expresses that she doesn't want to have to buy his love. She processes feelings related to this and asks if it is normal for her to feel how she has been feeling. She also asks what she should do regarding the money. She discusses ways in which she has been trying to stay busy and engage with others, such as allowing her 10yo neighbor to visit and see her bird, continuing to go to [place] activities, and she reports she signed up for an online class about real estate that is starting mid August. She shares that she will be starting [facility] again next week as well. She admits that she was feeling better overall last week and it was only the past few days that she was feeling dysphoric. She is receptive to feedback and denies SI at this time and contracts for safety.  Suicidal/Homicidal: Nowithout intent/plan  Therapist Response: Cln assessed for current stressors, symptoms,  and safety since last session. Cln utilized active listening and validation to help pt feel heard and understood, express thoughts and feelings, and gain insight into their experiences. Cln praised pt's use of coping skills and normalized her experience, as it has only been a year since her husband of 30 years died. Cln discussed that healing isn't linear and it does not mean  never having strong feelings again, but that one manages them appropriately and can function. Cln advises her that she should do what she feels is right concerning the money and helps pt process her thoughts and feelings surrounding it. Cln asked pt to share how she combats the negative thoughts and encouraged her to make a list of the people that love her. Cln again praised pt's use of coping skills and her continued efforts to stay busy and engage with others. Cln encouraged pt to explore more volunteer opportunities if needed and to create a weekly schedule if she becomes overwhelmed by the amount of things she has planned. Cln assessed for SI before ending the session. Cln scheduled follow-up appointment and confirmed pt's availability and preferred method of service delivery (virtual).  Plan: Return again in 1 weeks.  Diagnosis: GAD (generalized anxiety disorder)  Collaboration of Care: Other none required for this visit  Patient/Guardian was advised Release of Information must be obtained prior to any record release in order to collaborate their care with an outside provider. Patient/Guardian was advised if they have not already done so to contact the registration department to sign all necessary forms in order for [place] to release information regarding their care.   Consent: Patient/Guardian gives verbal consent for treatment and assignment of benefits for services provided during this visit. Patient/Guardian expressed understanding and agreed to proceed.   [name], LCSW 07/09/2023

## 2023-07-15 ENCOUNTER — Ambulatory Visit (INDEPENDENT_AMBULATORY_CARE_PROVIDER_SITE_OTHER): Payer: Medicaid Other | Admitting: Licensed Clinical Social Worker

## 2023-07-15 DIAGNOSIS — F3341 Major depressive disorder, recurrent, in partial remission: Secondary | ICD-10-CM

## 2023-07-15 DIAGNOSIS — F411 Generalized anxiety disorder: Secondary | ICD-10-CM

## 2023-07-15 NOTE — Progress Notes (Signed)
Virtual Visit via Video Note  I connected with Sabrina Palmer on 07/15/23 at  3:00 PM EDT by a video enabled telemedicine application and verified that I am speaking with the correct person using two identifiers.  Location: Patient: pt's home in [place], [place] Provider: clinical office in [place], [place]   I discussed the limitations of evaluation and management by telemedicine and the availability of in person appointments. The patient expressed understanding and agreed to proceed.   I discussed the assessment and treatment plan with the patient. The patient was provided an opportunity to ask questions and all were answered. The patient agreed with the plan and demonstrated an understanding of the instructions.   The patient was advised to call back or seek an in-person evaluation if the symptoms worsen or if the condition fails to improve as anticipated.  I provided 60 minutes of non-face-to-face time during this encounter.   [name], LCSW    THERAPIST PROGRESS NOTE  Session Time: 60 minutes  Participation Level: Active  Behavioral Response: CasualAlertAnxious and Depressed  Type of Therapy: Individual Therapy  Treatment Goals addressed: grief, wellness  ProgressTowards Goals: Progressing  Interventions: CBT  Summary: Sabrina Palmer is a 52 y.o. female who presents for f/u with this cln. She arrives on time and maintains appropriate eye contact throughout the session. She reports her weekend went well, as she went to a church conference and ran into an old acquaintance and joined her and her other friends. She reports she and this woman are going to stay in touch. She states this event brought up good emotions, made her feel closer to god. She states she has been thinking about the money from selling her husband's truck and has decided to wait to send it to her son, as she does not want to "reward bad behavior" or pressure him to contact her. She states her mood has  been somewhat improved. She states she continues to search for jobs and has not had any luck. She wonders if she is doing something wrong in the application process. She states she is worried about the impending tropical storm and is concerned about a power outage. She shares that she wishes she had learned how to do certain things, like starting their generator, while her husband was still alive. She discusses ongoing grief and shares that she started going to [facility]. She expresses concern that she is not healing as quickly as she should be. She shares feelings related to grief and the ongoing conflict with her son. She details what led to her ending contact with her own mother as well and discusses details of her family dynamics. She asks if cln believes family therapy could be beneficial. She is receptive to feedback.  Suicidal/Homicidal: Nowithout intent/plan  Therapist Response: Cln assessed for current stressors, symptoms, and safety since last session. Cln utilized active listening and validation to help pt feel heard and understood, express thoughts and feelings, and gain insight into their experiences. Cln reminded pt of her continued use of coping skills and her level of functioning to illustrate that she is doing very well and explained criteria for prolonged grief disorder, which pt does not have. Cln provided examples of problematic behaviors in the context of grief and advised pt against comparing herself to others. Cln discussed how pt's relationship with her mother may be informing the current conflict with her son, specifically pt's expectations. Cln praised pt's continued progress and expressed openness to allowing pt's son  to join a session should they start talking again and explained that this cln is not a family therapist, but pt's responsiveness to this cln would be beneficial in facilitating a therapeutic conversation with her son. Cln scheduled follow-up appointment and confirmed  pt's availability and preferred method of service delivery (virtual).   Plan: Return again in 1 weeks.  Diagnosis: GAD (generalized anxiety disorder)  MDD (major depressive disorder), recurrent, in partial remission (HCC)  Collaboration of Care: Other none required this visit  Patient/Guardian was advised Release of Information must be obtained prior to any record release in order to collaborate their care with an outside provider. Patient/Guardian was advised if they have not already done so to contact the registration department to sign all necessary forms in order for [place] to release information regarding their care.   Consent: Patient/Guardian gives verbal consent for treatment and assignment of benefits for services provided during this visit. Patient/Guardian expressed understanding and agreed to proceed.   [name], LCSW 07/15/2023

## 2023-07-23 ENCOUNTER — Ambulatory Visit (INDEPENDENT_AMBULATORY_CARE_PROVIDER_SITE_OTHER): Payer: Medicaid Other | Admitting: Licensed Clinical Social Worker

## 2023-07-23 DIAGNOSIS — F411 Generalized anxiety disorder: Secondary | ICD-10-CM

## 2023-07-23 DIAGNOSIS — F3341 Major depressive disorder, recurrent, in partial remission: Secondary | ICD-10-CM

## 2023-07-23 NOTE — Progress Notes (Signed)
Virtual Visit via Video Note  I connected with Sabrina Palmer on 07/23/23 at  3:00 PM EDT by a video enabled telemedicine application and verified that I am speaking with the correct person using two identifiers.  Location: Patient: pt's home in [place], [place] Provider: clinical home office in [place], [place]   I discussed the limitations of evaluation and management by telemedicine and the availability of in person appointments. The patient expressed understanding and agreed to proceed.   I discussed the assessment and treatment plan with the patient. The patient was provided an opportunity to ask questions and all were answered. The patient agreed with the plan and demonstrated an understanding of the instructions.   The patient was advised to call back or seek an in-person evaluation if the symptoms worsen or if the condition fails to improve as anticipated.  I provided 60 minutes of non-face-to-face time during this encounter.   [name], LCSW    THERAPIST PROGRESS NOTE  Session Time: 60 minutes  Participation Level: Active  Behavioral Response: CasualAlertAnxious and Depressed  Type of Therapy: Individual Therapy  Treatment Goals addressed: grief, depression  ProgressTowards Goals: Progressing  Interventions: CBT  Summary: Sabrina Palmer is a 52 y.o. female who presents for f/u with this cln. She arrives on time and maintains appropriate eye contact throughout the session. She reports she was looking through a journal that she has been writing in once or twice a week. She reports feeling worthless and useless due to depression, and can see where she felt this way one day back in May. She states that even when she fills her day with activities, she often feels this way. She shares how her session at [facility] resulted in her crying in group and feeling intense sadness, whereas she felt fine before. She states that in looking through her journal, she doesn't see  that things are getting better. 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 She states it usually makes her feel good, but this time it was really depressing. She states she got so upset last night that she had to take a Xanax. She talks about a 52yo woman and states she does not want to live to be that old and live that much longer without her husband. She states that seeing them in their various states and being somewhat lonely scares her because she feels completely alone. She expresses fear that no one will miss her when she dies and no one would be there to help her if she were to accidentally hurt herself at home. She shares feelings and fears related to this and discusses feeling useless. 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 She discusses feelings of shame related to having Medicaid and being unemployed for so long. She is receptive to feedback and her affect is brighter after discussing potential fun activities she could do tomorrow. She denies SI and contracts for safety.  Suicidal/Homicidal: Nowithout intent/plan  Therapist Response: Cln assessed for current stressors, symptoms, and safety since last session. Cln utilized active listening and validation to help pt feel heard and understood, express thoughts and  feelings, and gain insight into their experiences. Cln normalized the ups and downs of depression and grief and identified the ways in which she is progressing. For example, pt stated she hasn't felt like doing anything today and said that it's okay and she can do something tomorrow. Cln pointed out that she previously would have engaged  in negative self-talk about doing nothing rather than recognizing the need to slow down and do nothing. Cln explored with pt feelings of worthlessness and shame and offered other perspectives. Cln discussed the misconception that individuals with Medicaid don't work hard and pointed out the ways in which pt has been working hard, even though she does not have an income. Cln recommended pt identify a fun activity she could engage in tomorrow and reminded her that enjoyment is important to mental health. Cln helped pt brainstorm activities and praised pt's efforts. Cln scheduled follow-up appointment and confirmed pt's availability and preferred method of service delivery (virtual).   Plan: Return again in 1 weeks.  Diagnosis: GAD (generalized anxiety disorder)  MDD (major depressive disorder), recurrent, in partial remission (HCC)  Collaboration of Care: Other none required this visit  Patient/Guardian was advised Release of Information must be obtained prior to any record release in order to collaborate their care with an outside provider. Patient/Guardian was advised if they have not already done so to contact the registration department to sign all necessary forms in order for [place] to release information regarding their care.   Consent: Patient/Guardian gives verbal consent for treatment and assignment of benefits for services provided during this visit. Patient/Guardian expressed understanding and agreed to proceed.   [name], LCSW 07/23/2023

## 2023-07-24 ENCOUNTER — Encounter: Payer: Self-pay | Admitting: Psychiatry

## 2023-07-24 ENCOUNTER — Ambulatory Visit (INDEPENDENT_AMBULATORY_CARE_PROVIDER_SITE_OTHER): Payer: Medicaid Other | Admitting: Psychiatry

## 2023-07-24 VITALS — BP 121/81 | HR 89 | Temp 96.0°F | Ht 67.0 in | Wt 238.6 lb

## 2023-07-24 DIAGNOSIS — Z634 Disappearance and death of family member: Secondary | ICD-10-CM

## 2023-07-24 DIAGNOSIS — F332 Major depressive disorder, recurrent severe without psychotic features: Secondary | ICD-10-CM

## 2023-07-24 DIAGNOSIS — F411 Generalized anxiety disorder: Secondary | ICD-10-CM | POA: Diagnosis not present

## 2023-07-24 MED ORDER — QUETIAPINE FUMARATE 100 MG PO TABS
100.0000 mg | ORAL_TABLET | Freq: Every day | ORAL | 0 refills | Status: DC
Start: 2023-07-24 — End: 2024-05-26
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
## 2023-07-27 ENCOUNTER — Ambulatory Visit (HOSPITAL_COMMUNITY): Payer: Medicaid Other | Admitting: Licensed Clinical Social Worker

## 2023-07-27 DIAGNOSIS — F411 Generalized anxiety disorder: Secondary | ICD-10-CM

## 2023-07-27 DIAGNOSIS — F3341 Major depressive disorder, recurrent, in partial remission: Secondary | ICD-10-CM | POA: Diagnosis not present

## 2023-07-27 NOTE — Progress Notes (Signed)
Virtual Visit via Video Note  I connected with Sabrina Palmer on 07/27/23 at  2:00 PM EDT by a video enabled telemedicine application and verified that I am speaking with the correct person using two identifiers.  Location: Patient: pt's home in [place], [place] Provider: clinical home office in [place], [place]   I discussed the limitations of evaluation and management by telemedicine and the availability of in person appointments. The patient expressed understanding and agreed to proceed.   I discussed the assessment and treatment plan with the patient. The patient was provided an opportunity to ask questions and all were answered. The patient agreed with the plan and demonstrated an understanding of the instructions.   The patient was advised to call back or seek an in-person evaluation if the symptoms worsen or if the condition fails to improve as anticipated.  I provided 54 minutes of non-face-to-face time during this encounter.   [name], LCSW    THERAPIST PROGRESS NOTE  Session Time: 54 minutes  Participation Level: Active  Behavioral Response: CasualAlertAnxious and Depressed  Type of Therapy: Individual Therapy  Treatment Goals addressed: grief, depression  ProgressTowards Goals: Progressing  Interventions: CBT  Summary: Sabrina Palmer is a 52 y.o. female who presents for f/u with this cln. She arrives on time and maintains appropriate eye contact throughout the session. Pt reports she saw her psychiatrist on Thursday and the psychiatrist was very harsh. She states the psychiatrist made her feel bad about herself, like her depression is her fault or she's lazy. She reports she's not sure if she wants to return to that provider. She states she may want referrals later on but wants to leave it for now. 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 She reiterates that she thinks it's a good choice  for her. She then talks about her son and states she reconsidered her decision about the money from the sale of her husband's truck. She states she mailed him a check with a note stating there are no strings attached. She states part of her was perhaps punishing him and she doesn't want to do that. She talks about how she came to the decision and her feelings related to the situation. She talks about the ways in which she blames herself. She also reports she is having fewer feelings of uselessness. ***journal that. Discussed last period in December. Possible menopause? Get hormones checked. Get Vitamin D checked.  Suicidal/Homicidal: Nowithout intent/plan  Therapist Response: Cln assessed for current stressors, symptoms, and safety since last session. Cln utilized active listening and validation to help pt feel heard and understood, express thoughts and feelings, and gain insight into their experiences. ***. Cln scheduled follow-up appointment and confirmed pt's availability and preferred method of service delivery (virtual).   Plan: Return again in 1 weeks.  Diagnosis: MDD (major depressive disorder), recurrent, in partial remission (HCC)  GAD (generalized anxiety disorder)  Collaboration of Care: Other none required this visit  Patient/Guardian was advised Release of Information must be obtained prior to any record release in order to collaborate their care with an outside provider. Patient/Guardian was advised if they have not already done so to contact the registration department to sign all necessary forms in order for [place] to release information regarding their care.   Consent: Patient/Guardian gives verbal consent for treatment and assignment of benefits for services provided during this visit. Patient/Guardian expressed understanding and agreed  to proceed.   [name], LCSW 07/27/2023

## 2023-08-03 ENCOUNTER — Ambulatory Visit (INDEPENDENT_AMBULATORY_CARE_PROVIDER_SITE_OTHER): Payer: Medicaid Other | Admitting: Licensed Clinical Social Worker

## 2023-08-03 DIAGNOSIS — F411 Generalized anxiety disorder: Secondary | ICD-10-CM | POA: Diagnosis not present

## 2023-08-03 DIAGNOSIS — F339 Major depressive disorder, recurrent, unspecified: Secondary | ICD-10-CM | POA: Diagnosis not present

## 2023-08-03 DIAGNOSIS — F3341 Major depressive disorder, recurrent, in partial remission: Secondary | ICD-10-CM

## 2023-08-03 NOTE — Progress Notes (Signed)
Virtual Visit via Video Note  I connected with Sabrina Palmer on 08/03/23 at  1:00 PM EDT by a video enabled telemedicine application and verified that I am speaking with the correct person using two identifiers.  Location: Patient: pt's home in [place], [place] Provider: clinical home office in [place], [place]   I discussed the limitations of evaluation and management by telemedicine and the availability of in person appointments. The patient expressed understanding and agreed to proceed.   I discussed the assessment and treatment plan with the patient. The patient was provided an opportunity to ask questions and all were answered. The patient agreed with the plan and demonstrated an understanding of the instructions.   The patient was advised to call back or seek an in-person evaluation if the symptoms worsen or if the condition fails to improve as anticipated.  I provided 60 minutes of non-face-to-face time during this encounter.   [name], LCSW    THERAPIST PROGRESS NOTE  Session Time: 60 minutes  Participation Level: Active  Behavioral Response: CasualAlertAnxious, Hopeless, and Irritable  Type of Therapy: Individual Therapy  Treatment Goals addressed: grief, depression  ProgressTowards Goals: Progressing  Interventions: Supportive  Summary: Sabrina Palmer is a 52 y.o. female who presents for f/u with this cln. She arrives on time and maintains appropriate eye contact throughout the session. 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 She states she has learned some things and shares some of what she learned. She reports she had a bad night last night and explains she was up until 2 am, struggling with relaxing. She states she listened to a Bible verse that stated everything is useless, but then she really started to feel like everything is useless. Pt reports she is planning on stopping all of her meds with  the exception of her mental health medications because she does not want to live longer. She denies SI, stating she does not want to kill herself, but she doesn't want to do anything the prolongs her life. "I'm an adult of sound mind and there's nothing you or anyone else can do about it." "I know you want to say I'm doing good but I'm not." She shares feelings of grief, hopelessness, and anger regarding her husband and son. She states her son has not cashed the check yet and she was hopeful she would get some sort of response, even if that response was him cashing the check. She declines an alternate medication management referral, stating she does not want to go over her history with a new provider. She states being able to "get it all out" has made her feel a little better. She is receptive to feedback.  Suicidal/Homicidal: Nowithout intent/plan  Therapist Response: Cln assessed for current stressors, symptoms, and safety since last session. Cln utilized active listening and validation to help pt feel heard and understood, express thoughts and feelings, and gain insight into their experiences. Cln respected pt's right to self-determination regarding not taking her medications and advised her that potential side effects of discontinuing them may cause severe discomfort. Cln focused on validation and empathy due to pt's statements and expressed needs. Cln recommended pt seek out activities that make her feel good, such as a massage, and encouraged her to continue verbalizing her feelings exactly as she feels them, as demonstrated in this session. Cln informed pt the focus will be shifted to giving space for her pain rather  than focusing on things she can do to try and heal. Cln offered to refer to a new medication provider as previously discussed. Cln scheduled follow-up appointment and confirmed pt's availability and preferred method of service delivery (virtual).  Plan: Return again in 1  weeks.  Diagnosis: GAD (generalized anxiety disorder)  MDD (major depressive disorder), recurrent, in partial remission (HCC)  Collaboration of Care: Other none required for this visit  Patient/Guardian was advised Release of Information must be obtained prior to any record release in order to collaborate their care with an outside provider. Patient/Guardian was advised if they have not already done so to contact the registration department to sign all necessary forms in order for [place] to release information regarding their care.   Consent: Patient/Guardian gives verbal consent for treatment and assignment of benefits for services provided during this visit. Patient/Guardian expressed understanding and agreed to proceed.   [name], LCSW 08/03/2023

## 2023-08-04 ENCOUNTER — Other Ambulatory Visit: Payer: Self-pay | Admitting: Psychiatry

## 2023-08-04 DIAGNOSIS — F332 Major depressive disorder, recurrent severe without psychotic features: Secondary | ICD-10-CM

## 2023-08-11 ENCOUNTER — Ambulatory Visit (INDEPENDENT_AMBULATORY_CARE_PROVIDER_SITE_OTHER): Payer: Medicaid Other | Admitting: Licensed Clinical Social Worker

## 2023-08-11 DIAGNOSIS — F411 Generalized anxiety disorder: Secondary | ICD-10-CM

## 2023-08-11 NOTE — Progress Notes (Signed)
Virtual Visit via Video Note  I connected with Sabrina Palmer on 08/11/23 at  2:00 PM EDT by a video enabled telemedicine application and verified that I am speaking with the correct person using two identifiers.  Location: Patient: pt's home in [place], [place] Provider: clinical home office in [place], [place]   I discussed the limitations of evaluation and management by telemedicine and the availability of in person appointments. The patient expressed understanding and agreed to proceed.   I discussed the assessment and treatment plan with the patient. The patient was provided an opportunity to ask questions and all were answered. The patient agreed with the plan and demonstrated an understanding of the instructions.   The patient was advised to call back or seek an in-person evaluation if the symptoms worsen or if the condition fails to improve as anticipated.  I provided 59 minutes of non-face-to-face time during this encounter.   [name], LCSW    THERAPIST PROGRESS NOTE  Session Time: 59 minutes  Participation Level: Active  Behavioral Response: CasualAlertAnxious and Depressed  Type of Therapy: Individual Therapy  Treatment Goals addressed: depression, grief  ProgressTowards Goals: Progressing  Interventions: CBT and Supportive  Summary: Sabrina Palmer is a 52 y.o. female who presents with for f/u with this cln. She arrives on time and maintains appropriate eye contact throughout the session. Pt reports she tried to stop taking her hypertension medicine and couldn't tolerate the side effects for more than two days, so she started taking it again. She states she wants to discuss a confusing scenario with cln and shares that she got a Facebook friend request from her niece, who doesn't have anything to do with her mother (pt's sister) and who pt hasn't seen in 10 years. She states she had a short conversation with her over messenger and then told her sister about  it, and reports her sister didn't really respond at first and then the next day told pt that she thought it was a fake account. Pt states she can't think of why anyone would do that and states her sister made her feel stupid. She discusses her relationship with this particular sister and the ongoing dysfunction and pt's desire for a better relationship. She also shares that her young neighbor visited her over the weekend, which cheered pt up. Pt also reports she worked out at [employer] earlier this morning. She reports continued feelings of hopelessness and worthlessness and states these feelings are "up and down." She processes feelings related to feeling like she can't move on. She also shares that her son still has not cashed the check and she wonders if she should wait for 30 days and then issue a stop payment. She is receptive to feedback.  Suicidal/Homicidal: Nowithout intent/plan  Therapist Response: Cln assessed for current stressors, symptoms, and safety since last session. Cln utilized active listening and validation to help pt feel heard and understood, express thoughts and feelings, and gain insight into their experiences. Cln pointed out that pt didn't do anything wrong based on her account of the situation and offers the idea that her sister reacted out of feelings of disappointment. Cln explored with pt the dynamics between her and her sister and validated pt's feelings regarding wanting to be closer. Cln identified pt's strengths and reiterated pt is progressing and moving on with life and framed it as growing around grief. Cln scheduled follow-up appointment and confirmed pt's availability and preferred method of service delivery (virtual).  Plan:  Return again in 1 weeks.  Diagnosis: GAD (generalized anxiety disorder)  Collaboration of Care: Other none required for this visit  Patient/Guardian was advised Release of Information must be obtained prior to any record release in order to  collaborate their care with an outside provider. Patient/Guardian was advised if they have not already done so to contact the registration department to sign all necessary forms in order for [place] to release information regarding their care.   Consent: Patient/Guardian gives verbal consent for treatment and assignment of benefits for services provided during this visit. Patient/Guardian expressed understanding and agreed to proceed.   [name], LCSW 08/11/2023

## 2023-08-17 ENCOUNTER — Ambulatory Visit (INDEPENDENT_AMBULATORY_CARE_PROVIDER_SITE_OTHER): Payer: Medicaid Other | Admitting: Licensed Clinical Social Worker

## 2023-08-17 DIAGNOSIS — F411 Generalized anxiety disorder: Secondary | ICD-10-CM

## 2023-08-17 NOTE — Progress Notes (Signed)
Virtual Visit via Video Note  I connected with Sabrina Palmer on 08/17/23 at  2:00 PM EDT by a video enabled telemedicine application and verified that I am speaking with the correct person using two identifiers.  Location: Patient: pt's home in [place], [place] Provider: clinical office office in [place], [place]   I discussed the limitations of evaluation and management by telemedicine and the availability of in person appointments. The patient expressed understanding and agreed to proceed.   I discussed the assessment and treatment plan with the patient. The patient was provided an opportunity to ask questions and all were answered. The patient agreed with the plan and demonstrated an understanding of the instructions.   The patient was advised to call back or seek an in-person evaluation if the symptoms worsen or if the condition fails to improve as anticipated.  I provided 37 minutes of non-face-to-face time during this encounter.   [name], LCSW    THERAPIST PROGRESS NOTE  Session Time: 37 minutes  Participation Level: Active  Behavioral Response: CasualAlertAnxious and Tearful  Type of Therapy: Individual Therapy  Treatment Goals addressed: grief  ProgressTowards Goals: Progressing  Interventions: Supportive  Summary: Sabrina Palmer is a 52 y.o. female who presents for f/u with this cln. She arrives on time and maintains appropriate eye contact throughout the session. She states she has to end the session by 2:40 pm for Grief Share. She states she and her sister got into another argument regarding her niece and she had to tell her sister to leave her alone. She states her sister called her a bitch and pt previously would have reacted strongly but pt was able to refrain from doing so this time. "I think I've just got to accept it's not gonna be the kind of relationship I want, but I don't want to cut her out of my life." She states she is working on being a better  person and socializing more with others. 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 She states she is still considering selling her home. She reports she visited [facility] with her bird on Sunday and states she felt at peace and talked to her husband. She states she was hoping she wouldn't cry Sunday night since she had a good day, but she still did. She states she and her husband used to do a lot together on Sundays. She does say that it wasn't the worst Sunday she's had. She states she is not feeling suicidal or like she wants to die and is taking all of her meds. She states the feelings of worthlessness and uselessness come and go. She expresses some feelings of hope that she could fall in love again someday, although she doesn't want to get married again. She states she sometimes thinks losing her husband is a punishment for cutting off contact with her mother. She is receptive to feedback.  Suicidal/Homicidal: Yeswithout intent/plan  Therapist Response: Cln assessed for current stressors, symptoms, and safety since last session. Cln utilized active listening and validation to help pt feel heard and understood, express thoughts and feelings, and gain insight into their experiences. Cln pointed out pt's progress regarding how she is speaking of her sister and accepting the relationship she has with her and states her perspective reflects growth. Cln explores pt's view of her loss as a punishment and posits that viewing it as punishment is a way for it to make  more sense and provide a level of comfort. Cln scheduled follow-up appointment and confirmed pt's availability and preferred method of service delivery (virtual).  Plan: Return again in 1 weeks.  Diagnosis: GAD (generalized anxiety disorder)  Collaboration of Care: Other none required for this visit  Patient/Guardian was advised Release of Information must be obtained prior to any record  release in order to collaborate their care with an outside provider. Patient/Guardian was advised if they have not already done so to contact the registration department to sign all necessary forms in order for [place] to release information regarding their care.   Consent: Patient/Guardian gives verbal consent for treatment and assignment of benefits for services provided during this visit. Patient/Guardian expressed understanding and agreed to proceed.   [name], LCSW 08/17/2023

## 2023-08-20 ENCOUNTER — Other Ambulatory Visit (INDEPENDENT_AMBULATORY_CARE_PROVIDER_SITE_OTHER): Payer: Medicaid Other

## 2023-08-20 DIAGNOSIS — E0500 Thyrotoxicosis with diffuse goiter without thyrotoxic crisis or storm: Secondary | ICD-10-CM

## 2023-08-21 LAB — TSH: TSH: 1.43 u[IU]/mL (ref 0.35–5.50)

## 2023-08-21 LAB — T4, FREE: Free T4: 0.81 ng/dL (ref 0.60–1.60)

## 2023-08-21 LAB — T3, FREE: T3, Free: 3.4 pg/mL (ref 2.3–4.2)

## 2023-08-26 ENCOUNTER — Ambulatory Visit (INDEPENDENT_AMBULATORY_CARE_PROVIDER_SITE_OTHER): Payer: Medicaid Other | Admitting: Licensed Clinical Social Worker

## 2023-08-26 DIAGNOSIS — F411 Generalized anxiety disorder: Secondary | ICD-10-CM

## 2023-08-26 NOTE — Progress Notes (Addendum)
Virtual Visit via Video Note  I connected with Sabrina Palmer on 08/26/23 at  9:00 AM EDT by a video enabled telemedicine application and verified that I am speaking with the correct person using two identifiers.  Location: Patient: pt's home in [place], [place] Provider: clinical office in [place], [place]   I discussed the limitations of evaluation and management by telemedicine and the availability of in person appointments. The patient expressed understanding and agreed to proceed.   I discussed the assessment and treatment plan with the patient. The patient was provided an opportunity to ask questions and all were answered. The patient agreed with the plan and demonstrated an understanding of the instructions.   The patient was advised to call back or seek an in-person evaluation if the symptoms worsen or if the condition fails to improve as anticipated.  I provided 46 minutes of non-face-to-face time during this encounter.   [name], LCSW    THERAPIST PROGRESS NOTE  Session Time: 46 minutes  Participation Level: Active  Behavioral Response: CasualAlertAnxious  Type of Therapy: Individual Therapy  Treatment Goals addressed: grief  ProgressTowards Goals: Progressing  Interventions: CBT  Summary: Sabrina Palmer is a 52 y.o. female who presents for f/u with this cln. She arrives on time and maintains appropriate eye contact throughout the session. Pt reports her week has been okay and that she went to [facility]rief Share on Monday. She states the subject of suicide came up in Grief Share and she made comments about how it's cowardly and selfish, and she states she recognizes she was being judgmental. She states she felt like she brought the group down and wonders if she should go back. 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 She also shares that she  attended a women's conference that was helpful and she had a better Sunday evening than she usually does. She shares one of the helpful messages was that when one is experiencing a lack of purpose, you can try to do something for someone else. She reports she has thought about inviting her cousin to go to church with her but has put it off and isn't sure why. Upon further exploration, she shares that she worries her cousin will say no or pt will be too tired and cancel. She expresses concern that she will become severely depressed once she finishes her class. She shares that she spent last Thanksgiving and Christmas with her sister but is not currently speaking to her, so that will likely not be an option this year. She becomes brighter when talking through suggestions to spend the holidays serving others. She states she talks to her husband sometimes, but she believes he is gone and cannot hear her. She discusses the Bible and how in [place], there is no suffering so there is no way he could know what she is dealing with because it would hurt him. She states she can't believe what would be comforting because God's word is law. She states her pastor has invited parishioners to ask questions for a Q&A and she states she would like to ask about this since the Bible does not explicitly state whether deceased loved ones can see or hear [place]. She is receptive to feedback.  Suicidal/Homicidal: Nowithout intent/plan  Therapist Response: Cln assessed for current stressors, symptoms, and safety since last session. Cln utilized active listening and validation  to help pt feel heard and understood, express thoughts and feelings, and gain insight into their experiences. Cln asked pt to pause and examine her thoughts surrounding Grief Share and challenge the thought that people don't want her there. Cln reframed the interaction as a learning experience to help pt gain another perspective, as she has previously expressed she  believes she is too judgmental. Cln pointed out that she is viewing suicide from the lens of her losses and her experience with depression and her son cutting off contact with her, while others have different experiences. Cln pointed out other ways in which pt's experience may differ from others' and how those difference impact one's choices and their grief. Pt receptive, though states she may skip a week of Grief Share. Cln validated pt's feelings regarding inviting her cousin to church and pointed out she may be overextending herself while taking the class. Cln facilitated discussion of pt's beliefs surrounding the afterlife and cln suggested pt can choose to believe what comforts her. When pt was not receptive to this, cln explored further and supported pt in seeking guidance from religious leaders. Cln scheduled follow-up appointment and confirmed pt's availability and preferred method of service delivery (virtual).  Plan: Return again in 1 weeks.  Diagnosis: GAD (generalized anxiety disorder)  Collaboration of Care: Other none required for this visit  Patient/Guardian was advised Release of Information must be obtained prior to any record release in order to collaborate their care with an outside provider. Patient/Guardian was advised if they have not already done so to contact the registration department to sign all necessary forms in order for [place] to release information regarding their care.   Consent: Patient/Guardian gives verbal consent for treatment and assignment of benefits for services provided during this visit. Patient/Guardian expressed understanding and agreed to proceed.   [name], LCSW 08/26/2023

## 2023-08-27 ENCOUNTER — Telehealth (HOSPITAL_COMMUNITY): Payer: Self-pay | Admitting: Licensed Clinical Social Worker

## 2023-09-01 ENCOUNTER — Ambulatory Visit (INDEPENDENT_AMBULATORY_CARE_PROVIDER_SITE_OTHER): Payer: Medicaid Other | Admitting: Licensed Clinical Social Worker

## 2023-09-01 DIAGNOSIS — F411 Generalized anxiety disorder: Secondary | ICD-10-CM

## 2023-09-01 NOTE — Progress Notes (Signed)
Virtual Visit via Video Note  I connected with CAMILAH ERBES on 09/01/23 at  1:00 PM EDT by a video enabled telemedicine application and verified that I am speaking with the correct person using two identifiers.  Location: Patient: pt's home in [place], [place] Provider: clinical home office in [place], [place]   I discussed the limitations of evaluation and management by telemedicine and the availability of in person appointments. The patient expressed understanding and agreed to proceed.   I discussed the assessment and treatment plan with the patient. The patient was provided an opportunity to ask questions and all were answered. The patient agreed with the plan and demonstrated an understanding of the instructions.   The patient was advised to call back or seek an in-person evaluation if the symptoms worsen or if the condition fails to improve as anticipated.  I provided 57 minutes of non-face-to-face time during this encounter.   [name], LCSW    THERAPIST PROGRESS NOTE  Session Time: 57 minutes  Participation Level: Active  Behavioral Response: CasualAlertAnxious and Euthymic  Type of Therapy: Individual Therapy  Treatment Goals addressed: grief  ProgressTowards Goals: Progressing  Interventions: CBT  Summary: MERIEL LAMBING is a 52 y.o. female who presents for f/u with this cln. She arrives on time and maintains appropriate eye contact throughout the session. Pt reports the past week was good and states she had lunch with her sister. She discusses the lunch and details of her sister's conflict in her own home. She reports it was overall a pleasant lunch and is open to having a monthly lunch date with her sister. She states her sister told her she's lucky to live alone and have privacy and her own space, which pt states she took well. She also shares that she finally sent all of her husband's photos from his phone to her computer and in looking through them, she  realized she is also grieving the loss of her old life. She states she didn't cry on Sunday night like she normally does. She reports she posed the question regarding deceased loved ones being able to see or hear the living, and she hopes to get an answer this week. She states she looked up some of the places in the bible about Heaven and couldn't find anything specific. She engages in discussion regarding spiritual/existential questions and relates them to her grief. She states she agrees that she is making progress and is receptive to feedback.  Suicidal/Homicidal: Nowithout intent/plan  Therapist Response: Cln assessed for current stressors, symptoms, and safety since last session. Cln utilized active listening and validation to help pt feel heard and understood, express thoughts and feelings, and gain insight into their experiences. Cln inquired about pt's question from the previous session and facilitated a discussion surrounding life after death and higher power involvement in one's life. Cln noted that pt seems to be in good spirits today, more-so than she ever has been, and cln verbalizes that this is progress. Cln affirms that her husband would be proud of her and cln expresses pride as well, noting the work pt has put in and attributing the time that has passed and pt actively engaging in the grieving process vs trying to suppress her feelings. Cln scheduled follow-up appointment and confirmed pt's availability and preferred method of service delivery (virtual).   Plan: Return again in 1 weeks.  Diagnosis: GAD (generalized anxiety disorder)  Collaboration of Care: Other none required for this visit  Patient/Guardian was advised  Release of Information must be obtained prior to any record release in order to collaborate their care with an outside provider. Patient/Guardian was advised if they have not already done so to contact the registration department to sign all necessary forms in order for  [place] to release information regarding their care.   Consent: Patient/Guardian gives verbal consent for treatment and assignment of benefits for services provided during this visit. Patient/Guardian expressed understanding and agreed to proceed.   [name], LCSW 09/01/2023

## 2023-09-05 ENCOUNTER — Other Ambulatory Visit: Payer: Self-pay | Admitting: Psychiatry

## 2023-09-05 DIAGNOSIS — Z634 Disappearance and death of family member: Secondary | ICD-10-CM

## 2023-09-05 DIAGNOSIS — F332 Major depressive disorder, recurrent severe without psychotic features: Secondary | ICD-10-CM

## 2023-09-08 ENCOUNTER — Ambulatory Visit (INDEPENDENT_AMBULATORY_CARE_PROVIDER_SITE_OTHER): Payer: Medicaid Other | Admitting: Licensed Clinical Social Worker

## 2023-09-08 ENCOUNTER — Ambulatory Visit (INDEPENDENT_AMBULATORY_CARE_PROVIDER_SITE_OTHER): Payer: Medicaid Other | Admitting: Psychiatry

## 2023-09-08 ENCOUNTER — Encounter: Payer: Self-pay | Admitting: Psychiatry

## 2023-09-08 VITALS — BP 128/80 | HR 82 | Temp 96.5°F | Ht 67.0 in | Wt 236.4 lb

## 2023-09-08 DIAGNOSIS — F331 Major depressive disorder, recurrent, moderate: Secondary | ICD-10-CM

## 2023-09-08 DIAGNOSIS — F411 Generalized anxiety disorder: Secondary | ICD-10-CM

## 2023-09-08 DIAGNOSIS — F332 Major depressive disorder, recurrent severe without psychotic features: Secondary | ICD-10-CM

## 2023-09-08 DIAGNOSIS — F439 Reaction to severe stress, unspecified: Secondary | ICD-10-CM | POA: Insufficient documentation

## 2023-09-08 NOTE — Progress Notes (Signed)
Virtual Visit via Video Note  I connected with Sabrina Palmer on 09/08/23 at 10:00 AM EDT by a video enabled telemedicine application and verified that I am speaking with the correct person using two identifiers.  Location: Patient: pt's home in [place], [place] Provider: clinical home office in [place], [place]   I discussed the limitations of evaluation and management by telemedicine and the availability of in person appointments. The patient expressed understanding and agreed to proceed.   I discussed the assessment and treatment plan with the patient. The patient was provided an opportunity to ask questions and all were answered. The patient agreed with the plan and demonstrated an understanding of the instructions.   The patient was advised to call back or seek an in-person evaluation if the symptoms worsen or if the condition fails to improve as anticipated.  I provided 57 minutes of non-face-to-face time during this encounter.   [name], LCSW    THERAPIST PROGRESS NOTE  Session Time: 57 minutes  Participation Level: Active  Behavioral Response: CasualAlertAnxious and Depressed  Type of Therapy: Individual Therapy  Treatment Goals addressed: grief, depression  ProgressTowards Goals: Progressing  Interventions: CBT  Summary: Sabrina Palmer is a 52 y.o. female who presents for f/u with this cln. She arrives on time and maintains appropriate eye contact throughout the session. She reports she has a busy day ahead of her as she has three appointments and her class this evening. She states she is worried about becoming more depressed once her class is over. She states that one day last week she got depressed and was feeling hopeless and thinking she was a bad person and that's why her son will not speak to her. She shares additional details related to the conflict and shares examples of how her thoughts tend to spiral and how those thoughts impact her emotions. Pt  demonstrates a new capacity for empathy towards her daughter-in-law, as she discusses how she may have made her feel like she is not good enough for her son. She demonstrates progress as evidenced by this and by continuing to be receptive to feedback. She requires some redirection when discussing her son and his wife as she repeatedly makes statements such as, "I don't know where I went wrong" and "I thought I was a good mother" and cln reminds pt that the conflict is more complicated, etc. She writes down suggested helpful thoughts to replace cognitive distortions during the times in which she becomes stuck in unhealthy thought patterns. She also discusses potential ways to keep herself busy, such as taking/auditing another course and/or volunteering. She is receptive to feedback.  Suicidal/Homicidal: Nowithout intent/plan  Therapist Response: Cln assessed for current stressors, symptoms, and safety since last session. Cln utilized active listening and validation to help pt feel heard and understood, express thoughts and feelings, and gain insight into their experiences. Cln assisted with reframing pt's unhelpful thoughts and pointed out that bad things happen to good people and good people unintentionally hurt others and make mistakes. Cln also pointed out that it is more complicated than people being good or bad and pointed out the ways in which pt is a good person. Cln noted pt's expression of empathy towards her daughter-in-law and compared this to previous statements and noted the significant progress and praised pt. Cln assisted pt with more helpful thoughts to say when stuck in unhelpful thought patterns. Cln scheduled follow-up appointment and confirmed pt's availability and preferred method of service delivery (virtual).  Plan: Return again in 1 weeks.  Diagnosis: GAD (generalized anxiety disorder)  Collaboration of Care: Other none required for this visit  Patient/Guardian was advised Release  of Information must be obtained prior to any record release in order to collaborate their care with an outside provider. Patient/Guardian was advised if they have not already done so to contact the registration department to sign all necessary forms in order for [place] to release information regarding their care.   Consent: Patient/Guardian gives verbal consent for treatment and assignment of benefits for services provided during this visit. Patient/Guardian expressed understanding and agreed to proceed.   [name], LCSW 09/08/2023
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
## 2023-09-09 ENCOUNTER — Encounter: Payer: Self-pay | Admitting: Psychiatry

## 2023-09-09 ENCOUNTER — Telehealth (HOSPITAL_COMMUNITY): Payer: Self-pay

## 2023-09-09 ENCOUNTER — Ambulatory Visit (HOSPITAL_COMMUNITY): Payer: Medicaid Other | Admitting: Licensed Clinical Social Worker

## 2023-09-09 NOTE — Telephone Encounter (Signed)
certified mailed out dismissal letter and put it in the system also.

## 2023-09-09 NOTE — Telephone Encounter (Signed)
Medication management - Telephone call with patient, after discussing with Dr. Elna Breslow, to follow up on patient's request to be closed out as she did not want to return to see Dr. Elna Breslow again. Patient stated she did not feel there was enough empathy for current situation and that she just did not have a good connection with Dr. Elna Breslow.  Patient stated she felt more comfortable seeing her primacy care and plans to request they take over managing her medications when she sees them in the coming week.  Patient stated she did not feel she needed to be under a psychiatrist at this time and denied need or request to be set up with any other providers at this time.  Patient stated she has enough medications currently for her provider to take over with refills and will contact our office back if any issues during transition.  Patient stated it was fine to close out her record for medication management with Dr. Elna Breslow at this time and no need for any other follow up.

## 2023-09-09 NOTE — Telephone Encounter (Signed)
Thank you and will close out.Printing out a letter to be mailed.

## 2023-09-18 ENCOUNTER — Ambulatory Visit (INDEPENDENT_AMBULATORY_CARE_PROVIDER_SITE_OTHER): Payer: Medicaid Other | Admitting: Licensed Clinical Social Worker

## 2023-09-18 DIAGNOSIS — F411 Generalized anxiety disorder: Secondary | ICD-10-CM | POA: Diagnosis not present

## 2023-09-18 NOTE — Progress Notes (Signed)
Virtual Visit via Video Note  I connected with Sabrina Palmer on 09/18/23 at 10:00 AM EDT by a video enabled telemedicine application and verified that I am speaking with the correct person using two identifiers.  Location: Patient: pt's home in [place], [place] Provider: clinical home office in [place], [place]   I discussed the limitations of evaluation and management by telemedicine and the availability of in person appointments. The patient expressed understanding and agreed to proceed.   I discussed the assessment and treatment plan with the patient. The patient was provided an opportunity to ask questions and all were answered. The patient agreed with the plan and demonstrated an understanding of the instructions.   The patient was advised to call back or seek an in-person evaluation if the symptoms worsen or if the condition fails to improve as anticipated.  I provided 60 minutes of non-face-to-face time during this encounter.   [name], LCSW    THERAPIST PROGRESS NOTE  Session Time: 60 minutes  Participation Level: Active  Behavioral Response: CasualAlertAnxious  Type of Therapy: Individual Therapy  Treatment Goals addressed: new tx plan  ProgressTowards Goals: Revised    Interventions: CBT  Summary: Sabrina Palmer is a 52 y.o. female who presents for f/u with this cln. She arrives on time and maintains appropriate eye contact throughout the session. 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 She states she is scheduled to take it on 10/21. She also reports she had lunch with her sister and there were no arguments or disagreements. She then shares that she saw Dr. Elna Breslow, who made her cry and then asked her why she was crying. She states she doesn't think she has empathy and now she does not want to see any psychiatrists at all because they are "quacks." She states she  trusts this cln and wants to discuss forgiveness today. She states she is holding onto a lot of anger from her life. She shares some details of past events that still make her angry and states her sister K told her that their sister C never liked her and always resented her. She states she would like to forgive her mother for how she treated her. She asks if it would be beneficial to go to her mother's grave and tell her how she feels and tell her she's trying to forgive her. She explores thoughts and feelings related to her relationship with her mother, sister, and her son. She states her efforts to change are not enough to get her son to talk to her again. She reiterates that it feels like karma and when given feedback states she is going to have to remind herself that he is needing the distance because he feels that it's best for him and his family right now. She is receptive to feedback  Suicidal/Homicidal: Nowithout intent/plan  Therapist Response: Cln assessed for current stressors, symptoms, and safety since last session. Cln utilized active listening and validation to help pt feel heard and understood, express thoughts and feelings, and gain insight into their experiences. Cln congratulated pt for passing her test and framed pt's decision to stop seeing her psychiatrist as her advocating for herself. Cln facilitated discussion of family dynamics and forgiveness, emphasizing the importance of developing empathy for oneself and the one you are trying to forgive. Cln disagreed that pt's efforts are not enough and explained that her  efforts are enough because she is changing for herself. Cln requested pt add "It's enough" and "I am enough" on her list of reframing statements and pt did so. Cln challenged the notion that her son's behavior is her karma and reminded her of the different circumstances. Cln scheduled follow-up appointment and confirmed pt's availability and preferred method of service delivery  (virtual).  Plan: Return again in 1 weeks.  Diagnosis: GAD (generalized anxiety disorder)  Collaboration of Care: Other none required for this visit  Patient/Guardian was advised Release of Information must be obtained prior to any record release in order to collaborate their care with an outside provider. Patient/Guardian was advised if they have not already done so to contact the registration department to sign all necessary forms in order for [place] to release information regarding their care.   Consent: Patient/Guardian gives verbal consent for treatment and assignment of benefits for services provided during this visit. Patient/Guardian expressed understanding and agreed to proceed.   [name], LCSW 09/18/2023

## 2023-09-23 ENCOUNTER — Ambulatory Visit (INDEPENDENT_AMBULATORY_CARE_PROVIDER_SITE_OTHER): Payer: Medicaid Other | Admitting: Licensed Clinical Social Worker

## 2023-09-23 DIAGNOSIS — F411 Generalized anxiety disorder: Secondary | ICD-10-CM

## 2023-09-23 NOTE — Progress Notes (Signed)
Virtual Visit via Video Note  I connected with [name] on 09/23/23 at  2:00 PM EDT by a video enabled telemedicine application and verified that I am speaking with the correct person using two identifiers.  Location: Patient: pt's home in [place], [place] Provider: clinical office in [place], [place]   I discussed the limitations of evaluation and management by telemedicine and the availability of in person appointments. The patient expressed understanding and agreed to proceed.  I discussed the assessment and treatment plan with the patient. The patient was provided an opportunity to ask questions and all were answered. The patient agreed with the plan and demonstrated an understanding of the instructions.   The patient was advised to call back or seek an in-person evaluation if the symptoms worsen or if the condition fails to improve as anticipated.  I provided 58 minutes of non-face-to-face time during this encounter.   [name], LCSW    THERAPIST PROGRESS NOTE  Session Time: 58 minutes  Participation Level: Active  Behavioral Response: CasualAlertEuthymic and mildly tearful  Type of Therapy: Individual Therapy  Treatment Goals addressed: grief, forgiveness  ProgressTowards Goals: Progressing  Interventions: CBT  Summary: Sabrina Palmer is a 52 y.o. female who presents for f/u with this cln. She arrives on time and maintains appropriate eye contact throughout the session. She reports her week has been good and that she did not have as bad a Sunday as usual. She also reports she had lunch with her sister on Monday and that it went well for the most part. She shares that she shared her goal of forgiveness and her sister laughed it off, and pt reports her sister usually seems normal and then demonstrates narcissistic traits. She states she has to accept her sister for who she is. She also states she hugged her sister for the first time in months and shares that she  has started hugging others more in general. 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 She states she still plans to visit her mother's grave but has not done so yet. She also shares that she believes she has gotten through the worst of her grief, as she does not feel hopeless and can picture her life in 10 years. She states they discussed survivor's guilt at [facility] on Monday and she processes feelings related to that. She states she still talks to her husband even though she doesn't know if he can hear her. She repeats that she can't feel him. She shares some memories and recalls recent dreams she had about him as well as her first love and states she doesn't understand why she is having these particular dreams. She reports she is ready to step down to every other week for therapy. She is receptive to feedback.  Suicidal/Homicidal: Nowithout intent/plan  Therapist Response: Cln assessed for current stressors, symptoms, and safety since last session. Cln utilized active listening and validation to help pt feel heard and understood, express thoughts and feelings, and gain insight into their experiences. Cln invited pt to discuss forgiveness and grief and encouraged pt to take her time in visiting her mother's grave and in telling her all the things she wants to say. Cln invited pt to reflect on how it feels to have made it through the worst part of  grief and cln described the process as non-linear, explaining that any future periods of intense grief are not setbacks. Cln informed pt that dreams are often not literal in nature and suggested potential feelings her dreams were expressing based on the content. Cln encouraged pt not to overthink any dreams. Cln stated that reducing sessions to every other week is appropriate. Cln scheduled  follow-up appointment and confirmed pt's availability and preferred method of service delivery (virtual).  Plan: Return again in 2 weeks.  Diagnosis: GAD (generalized anxiety disorder)  Collaboration of Care: Other none required for this visit  Patient/Guardian was advised Release of Information must be obtained prior to any record release in order to collaborate their care with an outside provider. Patient/Guardian was advised if they have not already done so to contact the registration department to sign all necessary forms in order for [place] to release information regarding their care.   Consent: Patient/Guardian gives verbal consent for treatment and assignment of benefits for services provided during this visit. Patient/Guardian expressed understanding and agreed to proceed.   [name], LCSW 09/23/2023

## 2023-10-05 ENCOUNTER — Ambulatory Visit (HOSPITAL_COMMUNITY): Payer: Medicaid Other | Admitting: Licensed Clinical Social Worker

## 2023-10-12 ENCOUNTER — Encounter (HOSPITAL_COMMUNITY): Payer: Self-pay | Admitting: Licensed Clinical Social Worker

## 2023-10-12 ENCOUNTER — Ambulatory Visit (INDEPENDENT_AMBULATORY_CARE_PROVIDER_SITE_OTHER): Payer: Medicaid Other | Admitting: Licensed Clinical Social Worker

## 2023-10-12 DIAGNOSIS — F411 Generalized anxiety disorder: Secondary | ICD-10-CM

## 2023-10-12 NOTE — Progress Notes (Signed)
Virtual Visit via Video Note  I connected with Sabrina Palmer on 10/12/23 at  9:00 AM EST by a video enabled telemedicine application and verified that I am speaking with the correct person using two identifiers.  Location: Patient: pt's home in [place], [place] Provider: clinical office in [place], [place]   I discussed the limitations of evaluation and management by telemedicine and the availability of in person appointments. The patient expressed understanding and agreed to proceed.   I discussed the assessment and treatment plan with the patient. The patient was provided an opportunity to ask questions and all were answered. The patient agreed with the plan and demonstrated an understanding of the instructions.   The patient was advised to call back or seek an in-person evaluation if the symptoms worsen or if the condition fails to improve as anticipated.  I provided 5 minutes of non-face-to-face time during this encounter.   [name], LCSW   Cln began the session by addressing having to reschedule previous session. Cln informed pt that, per front office staff, cln was told pt expressed frustration about being called early for her last appointment to be rescheduled. Cln explained that patients have to be called asap to be rescheduled so that patients do not come to the office for no reason and to ensure they are rescheduled promptly. Pt stated she does not remember that and then stated she merely requested not to be called before 8:00 am. Cln accepted this and attempted to move on to other topics. Pt became irritable and said she does not want to talk today and would like to cancel the appointment. Cln suggested pt talk through her feelings and process and pt reported feeling angry. Pt then stated she doesn't need anything else put on her right now and that she doesn't want to "talk through it." Pt stated, "I'm going to cancel today and not schedule any more appointments."   Cln will  send pt MyChart message or email offering to reschedule when pt is ready.   Patient/Guardian was advised Release of Information must be obtained prior to any record release in order to collaborate their care with an outside provider. Patient/Guardian was advised if they have not already done so to contact the registration department to sign all necessary forms in order for [place] to release information regarding their care.   Consent: Patient/Guardian gives verbal consent for treatment and assignment of benefits for services provided during this visit. Patient/Guardian expressed understanding and agreed to proceed.   [name], LCSW 10/12/2023

## 2023-12-07 LAB — HM DIABETES EYE EXAM

## 2023-12-24 ENCOUNTER — Ambulatory Visit: Payer: Medicaid Other | Admitting: Internal Medicine

## 2023-12-24 ENCOUNTER — Encounter: Payer: Self-pay | Admitting: Internal Medicine

## 2023-12-24 VITALS — BP 122/70 | HR 94 | Ht 67.0 in | Wt 236.0 lb

## 2023-12-24 DIAGNOSIS — E041 Nontoxic single thyroid nodule: Secondary | ICD-10-CM

## 2023-12-24 DIAGNOSIS — E1165 Type 2 diabetes mellitus with hyperglycemia: Secondary | ICD-10-CM

## 2023-12-24 DIAGNOSIS — E0500 Thyrotoxicosis with diffuse goiter without thyrotoxic crisis or storm: Secondary | ICD-10-CM | POA: Diagnosis not present

## 2023-12-24 DIAGNOSIS — Z794 Long term (current) use of insulin: Secondary | ICD-10-CM

## 2023-12-24 LAB — POCT GLYCOSYLATED HEMOGLOBIN (HGB A1C): Hemoglobin A1C: 5.6 % (ref 4.0–5.6)

## 2023-12-24 MED ORDER — OZEMPIC (1 MG/DOSE) 4 MG/3ML ~~LOC~~ SOPN: 1.0000 mg | PEN_INJECTOR | SUBCUTANEOUS | 3 refills | Status: DC

## 2023-12-24 NOTE — Patient Instructions (Addendum)
Please increase: - Ozempic 1 mg weekly  Stop Metformin.  Continue Methimazole 5 mg every other day.  Please stop at the lab.  Please come back for a follow-up appointment in 6 months.
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
## 2023-12-25 ENCOUNTER — Ambulatory Visit
Admission: RE | Admit: 2023-12-25 | Discharge: 2023-12-25 | Disposition: A | Discharge status: Home / Self Care | Payer: Medicaid Other | Source: Ambulatory Visit | Attending: Internal Medicine | Admitting: Internal Medicine

## 2023-12-25 DIAGNOSIS — E041 Nontoxic single thyroid nodule: Secondary | ICD-10-CM

## 2023-12-25 LAB — T4, FREE: Free T4: 1 ng/dL (ref 0.8–1.8)

## 2023-12-25 LAB — MICROALBUMIN / CREATININE URINE RATIO
Creatinine, Urine: 138 mg/dL (ref 20–275)
Microalb Creat Ratio: 7 mg/g{creat} (ref <30)
Microalb, Ur: 0.9 mg/dL

## 2023-12-25 LAB — T3, FREE: T3, Free: 3.2 pg/mL (ref 2.3–4.2)

## 2023-12-25 LAB — TSH: TSH: 1.44 m[IU]/L

## 2023-12-28 ENCOUNTER — Encounter: Payer: Self-pay | Admitting: Internal Medicine

## 2023-12-29 ENCOUNTER — Other Ambulatory Visit: Payer: Self-pay | Admitting: Internal Medicine

## 2023-12-29 DIAGNOSIS — E041 Nontoxic single thyroid nodule: Secondary | ICD-10-CM

## 2023-12-31 ENCOUNTER — Other Ambulatory Visit (HOSPITAL_COMMUNITY): Admission: RE | Admit: 2023-12-31 | Payer: Medicaid Other | Source: Ambulatory Visit

## 2023-12-31 ENCOUNTER — Other Ambulatory Visit (HOSPITAL_COMMUNITY)
Admission: RE | Admit: 2023-12-31 | Discharge: 2023-12-31 | Disposition: A | Discharge status: Home / Self Care | Payer: Medicaid Other | Source: Ambulatory Visit | Attending: Internal Medicine | Admitting: Internal Medicine

## 2023-12-31 ENCOUNTER — Ambulatory Visit
Admission: RE | Admit: 2023-12-31 | Discharge: 2023-12-31 | Disposition: A | Discharge status: Home / Self Care | Payer: Medicaid Other | Source: Ambulatory Visit | Attending: Internal Medicine | Admitting: Internal Medicine

## 2023-12-31 DIAGNOSIS — E041 Nontoxic single thyroid nodule: Secondary | ICD-10-CM | POA: Insufficient documentation

## 2024-01-01 LAB — CYTOLOGY - NON PAP

## 2024-04-28 ENCOUNTER — Telehealth: Payer: Self-pay

## 2024-04-28 NOTE — Telephone Encounter (Signed)
 I called and spoke with the patient. She states that this started x 1week She states that its sore when she swallows saliva and food. She had a thyroid  US  back in January with a Biopsy. She is afraid that it may be cancer, because she also lost her Husband to cancer. I advised to her to reach out to her PCP to see If they would be willing to see her to make sure its not a viral infection    I have called and spoke with Dr. Rosalea Collin on the phone and she advises:  Claritin, and Aleve. Drink hot liquids   Thyroid  Nodules would not grow in 5 months (Not going to give her pain)  She could have a viral, fungal infection

## 2024-04-28 NOTE — Telephone Encounter (Signed)
 I called the patient back and advised her of Dr. Candie Chamber recommendations and she voiced understanding.

## 2024-04-28 NOTE — Telephone Encounter (Signed)
 Patient called and left a vm stating she is having some trouble swallowing and some pain when she swallows. She is not sure if its her Thyroid  or not.

## 2024-05-20 ENCOUNTER — Telehealth: Payer: Self-pay

## 2024-05-20 NOTE — Telephone Encounter (Signed)
 Patient called stating she is having some issues swallowing. She believes its coming from the nodules that were found on her US  back in January. She would like to see Dr. Aldona Amel sooner than her appt in July.

## 2024-05-26 ENCOUNTER — Ambulatory Visit: Admitting: Internal Medicine

## 2024-05-26 ENCOUNTER — Encounter: Payer: Self-pay | Admitting: Internal Medicine

## 2024-05-26 ENCOUNTER — Other Ambulatory Visit: Payer: Self-pay | Admitting: Internal Medicine

## 2024-05-26 VITALS — BP 120/70 | HR 76 | Ht 67.0 in | Wt 226.8 lb

## 2024-05-26 DIAGNOSIS — E66813 Obesity, class 3: Secondary | ICD-10-CM | POA: Diagnosis not present

## 2024-05-26 DIAGNOSIS — E1165 Type 2 diabetes mellitus with hyperglycemia: Secondary | ICD-10-CM | POA: Diagnosis not present

## 2024-05-26 DIAGNOSIS — E0500 Thyrotoxicosis with diffuse goiter without thyrotoxic crisis or storm: Secondary | ICD-10-CM | POA: Diagnosis not present

## 2024-05-26 DIAGNOSIS — Z794 Long term (current) use of insulin: Secondary | ICD-10-CM

## 2024-05-26 DIAGNOSIS — E041 Nontoxic single thyroid nodule: Secondary | ICD-10-CM

## 2024-05-26 MED ORDER — ONETOUCH VERIO VI STRP: ORAL_STRIP | 3 refills | Status: DC
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
## 2024-05-26 NOTE — Patient Instructions (Addendum)
 Please continue: - Metformin  500 mg daily - Ozempic  1 mg weekly  Continue Methimazole  5 mg every other day.  Please stop at the lab.  Try to start Prilosec or Nexium 2x a day for 14 days.  Please come back for a follow-up appointment in 6 months.

## 2024-05-27 ENCOUNTER — Ambulatory Visit: Payer: Self-pay | Admitting: Internal Medicine

## 2024-05-27 LAB — T4, FREE: Free T4: 1.1 ng/dL (ref 0.8–1.8)

## 2024-05-27 LAB — T3, FREE: T3, Free: 3.4 pg/mL (ref 2.3–4.2)

## 2024-05-27 LAB — TSH: TSH: 1.15 m[IU]/L

## 2024-05-27 MED ORDER — ACCU-CHEK GUIDE TEST VI STRP: ORAL_STRIP | 12 refills | Status: AC

## 2024-05-27 MED ORDER — ACCU-CHEK SOFTCLIX LANCETS MISC
3 refills | Status: AC
Start: 2024-05-27 — End: ?

## 2024-05-27 MED ORDER — ACCU-CHEK GUIDE W/DEVICE KIT
PACK | 0 refills | Status: AC
Start: 2024-05-27 — End: ?

## 2024-05-27 NOTE — Telephone Encounter (Signed)
 Received a message from the pharmacy stating that her insurance does not cover onetouch they only cover accu-chek now.

## 2024-06-13 ENCOUNTER — Telehealth: Payer: Self-pay | Admitting: Pharmacy Technician

## 2024-06-13 ENCOUNTER — Other Ambulatory Visit (HOSPITAL_COMMUNITY): Payer: Self-pay

## 2024-06-13 NOTE — Telephone Encounter (Signed)
 Pharmacy Patient Advocate Encounter   Received notification from CoverMyMeds that prior authorization for Ozempic  (0.25 or 0.5 MG/DOSE) 2MG /3ML pen-injectors is due for renewal.   Insurance verification completed.   The patient is insured through [facility].  Action: Medication has been discontinued. Archived Key: [id number]  **Patient is now taking the 1 MG/DOSE.**

## 2024-06-22 ENCOUNTER — Ambulatory Visit: Payer: Medicaid Other | Admitting: Internal Medicine

## 2024-08-16 ENCOUNTER — Other Ambulatory Visit: Payer: Self-pay | Admitting: Internal Medicine

## 2024-09-18 NOTE — Progress Notes (Signed)
 Subjective:    Sabrina Palmer is a 53 y.o. female who presents for general physical exam.  Patient comes in today for physical exam.  Feels like she is starting to do some better.  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  She feels that she is finally starting to get past her husband's death about 2 years ago.  Has not had lab work.  Did not do the Cologuard and declines any type of colon cancer screening but will do a mammogram and due next year for a Pap. PMH, SH, FH and allergies were reviewed and updated today  Past Medical History:  Diagnosis Date  . Allergic state   . Anxiety   . Arthritis   . Depression   . Diabetes mellitus without complication (CMS/HHS-HCC)   . Fracture   . Graves disease   . Hypertension     Past Surgical History:  Procedure Laterality Date  . Right foot      Prior to Admission medications  Medication Sig Taking? Last Dose  albuterol  90 mcg/actuation inhaler USE 2 INHALATIONS BY MOUTH  EVERY 4 HOURS AS NEEDED FOR WHEEZING Yes Taking  ALPRAZolam  (XANAX ) 0.5 MG tablet TAKE 1/2 TO 1 TABLET BY  MOUTH TWICE DAILY AS NEEDED Yes Taking  buPROPion  (WELLBUTRIN ) 75 MG tablet Take 1 tablet (75 mg total) by mouth once daily Yes Taking  DULoxetine  (CYMBALTA ) 60 MG DR capsule TAKE 1 CAPSULE BY MOUTH TWICE A DAY Yes Taking  losartan-hydroCHLOROthiazide  (HYZAAR) 100-12.5 mg tablet TAKE 1 TABLET BY MOUTH EVERY DAY Yes Taking  methIMAzole  (TAPAZOLE ) 5 MG tablet Take 2.5 mg by mouth once daily Yes Taking  metoprolol  SUCCinate (TOPROL -XL) 100 MG XL tablet TAKE 1 & 1/2 TABLET BY MOUTH ONCE DAILY Yes Taking  multivitamin capsule Take 1 capsule by mouth once daily. Yes Taking  ONETOUCH DELICA LANCETS 30 gauge Misc USE TO CHECK SUGAR DAILY Yes Taking  ONETOUCH VERIO test strip USE AS INSTRUCTED TO CHECK SUGAR DAILY Yes Taking  semaglutide  (OZEMPIC ) 0.25 mg or 0.5 mg(2 mg/1.5 mL) pen injector  Inject 0.5 mg subcutaneously once a week Yes Taking  levonorgestrel-ethinyl estradiol (SEASONALE) 0.15 mg-30 mcg (91) tablet TAKE 1 TABLET BY MOUTH ONCE DAILY    metFORMIN  (GLUCOPHAGE -XR) 500 MG XR tablet Take 1 tablet (500 mg total) by mouth 2 (two) times daily with meals      Allergies  Allergen Reactions  . Lisinopril Cough    Family History  Problem Relation Name Age of Onset  . Allergic rhinitis Mother    . Asthma Mother    . High blood pressure (Hypertension) Mother    . Osteoporosis (Thinning of bones) Mother    . Prostate cancer Father    . Diabetes type II Father    . High blood pressure (Hypertension) Father    . Hereditary spherocytosis Father    . Hereditary spherocytosis Sister    . Hereditary spherocytosis Brother    . Osteoporosis (Thinning of bones) Maternal Grandmother    . Depression Maternal Grandfather    . Breast cancer Paternal Grandmother      Social History   Tobacco Use  . Smoking status: Never    Passive exposure: Never  . Smokeless tobacco: Never  Substance Use Topics  . Alcohol use: Not Currently    Comment: Occasionally  . Drug use: No        Regarding  wellness: patient's health maintenance was reviewed at this visit which includes immunizations, pap smear status, mammogram status, colon cancer screen status, and Bone Density status as appropriate for age   Review of Systems Constitutional: negative  Eyes: negative for icterus, irritation, redness and visual disturbance Ears, nose, mouth, throat, and face: negative for earaches, hearing loss, hoarseness, nasal congestion, sore throat, tinnitus and voice change Respiratory: negative for cough, dyspnea on exertion, pleurisy/chest pain, sputum and wheezing Cardiovascular: negative for chest pain, chest pressure/discomfort, , exertional chest pressure/discomfort, irregular heart beat, near-syncope, orthopnea, palpitations, paroxysmal nocturnal dyspnea,  tachypnea Gastrointestinal: negative  for abdominal pain, change in bowel habits, constipation, diarrhea, dyspepsia, dysphagia, melena, nausea, reflux symptoms and vomiting Genitourinary:negative for abnormal menstrual periods, genital lesions, hot flashes, sexual problems and vaginal discharge, decreased stream, dysuria, frequency, hematuria, hesitancy, nocturia and urinary incontinence Integument:: negative for pruritus, rash, skin color change and skin lesion(s) Breast: negative for breast lump, nipple discharge, color change Hematologic/lymphatic: negative for bleeding, easy bruising, lymphadenopathy and petechiae Musculoskeletal:negative for arthralgias, back pain, bone pain, muscle weakness, myalgias and neck pain Neurological: negative for coordination problems, dizziness, gait problems, headaches, memory problems, paresthesia, speech problems, tremors, vertigo and weakness Behavioral/Psych: negative for anxiety, depression and irritability Endocrine: negative for cold intolerance and heat intolerance Allergic/Immunologic: negative for angioedema and urticaria    Objective:   BP 120/72   Pulse 104   Ht 170.2 cm (5' 7)   Wt 100.7 kg (222 lb)   BMI 34.77 kg/m    General Appearance:  Alert, cooperative, no distress, appears stated age  Head:  Normocephalic, without obvious abnormality, atraumatic  Eyes:  PERRL, conjunctiva/corneas clear, EOM's intact, both eyes  Ears:  Normal TM's and external ear canals, both ears  Nose: Septum midline,mucosa normal, no drainage, ulcerations or sinus tenderness  Throat: Lips, mucosa, and tongue normal; teeth and gums normal. No lesions seen.  Neck: Supple, symmetrical, trachea midline, no adenopathy;  thyroid : not enlarged, symmetric, no tenderness/masses/nodules; carotids 2+ bilaterally, no carotid bruit or JVD  Back:   Symmetric, no curvature, ROM normal, no CVA tenderness  Lungs:   Clear to auscultation bilaterally  Breasts:  Deferred  Heart:  Regular rate and rhythm, S1 and S2  normal, no murmurs, rubs, or gallop  Abdomen:   Soft, non-tender, bowel sounds active, no masses, no organomegaly                          Genitalia: Deferred                             Vagina:                              Cervix:              Uterus/Adnexae:   Rectal:   Extremities: Extremities normal, no cyanosis or edema. No deformities; no active joint inflammation.  Small half centimeter bony lesion on her lateral left wrist that is tender to palpation  Pulses: Dorsalis pedis palpable and symmetric bilaterally.  Skin: Skin color, texture, turgor normal. No rashes. No suspicious lesions or moles.  Lymph nodes: No significant cervical, supraclavicular axillary or inguinal lymphadenopathy noted  Neurologic: Alert and oriented; speech intact; face symmetrical; moves all extremities well.     Assessment:   See below   Plan:   What diagnoses and all orders  for this visit:  Routine adult health maintenance -     CBC w/auto Differential (3 Part) -     Comprehensive Metabolic Panel (CMP) -     Urinalysis w/Microscopic -     CBC w/auto Differential (3 Part); Future -     Lipid Panel w/calc LDL; Future -     Comprehensive Metabolic Panel (CMP); Future -     Urinalysis w/Microscopic; Future Encouraged healthy diet, regular exercise, regular health maintenance   Need for vaccination -     FLU VACCINE MDCK IIV3 (EGG FREE), IM PF, (11MO+)(FLUCELVAX)  Essential hypertension Good control, continue to monitor, continue diet and exercise, current medication Hyperlipidemia, unspecified hyperlipidemia type Good control, continue diet and exercise Graves disease Continue to follow with endocrinology, last labs within normal limits Adjustment disorder with mixed anxiety and depressed mood Continues to improve, continue to monitor Diabetes mellitus without complication (CMS/HHS-HCC) Good control, continue to follow with endocrinology Left wrist pain -     X-ray wrist left 3 plus views;  Future  Other orders -     Follow up in Primary Care -     Follow up in Primary Care; Future     Follow up in 1 year (on 09/21/2025). Follow-up     Normal Orders This Visit   Follow up in Primary Care [[id number] Custom]    Questions:   Does this order need to be coordinated with another visit or should it be hidden from the patient portal? If yes to either, the patient will need to stop by the front desk or call to schedule.: No   Who is this follow-up with?: PCP   What type of follow up is needed?: Physical   What's the reason for follow up?:     Future Labs/Procedures Expected by Expires   Follow up in Primary Care [[id number] Custom]  09/21/2025 11/21/2025   Questions:     Does this order need to be coordinated with another visit or should it be hidden from the patient portal? If yes to either, the patient will need to stop by the front desk or call to schedule.: No   Who is this follow-up with?: PCP   What type of follow up is needed?: Physical   What's the reason for follow up?:             Portions of this note were potentially done with voice recognition technology and may contain typographical errors.

## 2024-09-27 ENCOUNTER — Other Ambulatory Visit: Payer: Self-pay | Admitting: Family Medicine

## 2024-09-27 DIAGNOSIS — Z1231 Encounter for screening mammogram for malignant neoplasm of breast: Secondary | ICD-10-CM

## 2024-10-22 ENCOUNTER — Emergency Department

## 2024-10-22 ENCOUNTER — Other Ambulatory Visit: Payer: Self-pay

## 2024-10-22 ENCOUNTER — Emergency Department
Admission: EM | Admit: 2024-10-22 | Discharge: 2024-10-22 | Disposition: A | Discharge status: Home / Self Care | Attending: Emergency Medicine | Admitting: Emergency Medicine

## 2024-10-22 DIAGNOSIS — S0990XA Unspecified injury of head, initial encounter: Secondary | ICD-10-CM | POA: Diagnosis present

## 2024-10-22 DIAGNOSIS — S0083XA Contusion of other part of head, initial encounter: Secondary | ICD-10-CM | POA: Insufficient documentation

## 2024-10-22 DIAGNOSIS — S060X0A Concussion without loss of consciousness, initial encounter: Secondary | ICD-10-CM | POA: Insufficient documentation

## 2024-10-22 DIAGNOSIS — W19XXXA Unspecified fall, initial encounter: Secondary | ICD-10-CM

## 2024-10-22 DIAGNOSIS — Y92480 Sidewalk as the place of occurrence of the external cause: Secondary | ICD-10-CM | POA: Insufficient documentation

## 2024-10-22 DIAGNOSIS — I10 Essential (primary) hypertension: Secondary | ICD-10-CM | POA: Diagnosis not present

## 2024-10-22 DIAGNOSIS — W01198A Fall on same level from slipping, tripping and stumbling with subsequent striking against other object, initial encounter: Secondary | ICD-10-CM | POA: Insufficient documentation

## 2024-10-22 DIAGNOSIS — E119 Type 2 diabetes mellitus without complications: Secondary | ICD-10-CM | POA: Insufficient documentation

## 2024-10-22 DIAGNOSIS — Y9301 Activity, walking, marching and hiking: Secondary | ICD-10-CM | POA: Diagnosis not present

## 2024-10-22 LAB — CBC WITH DIFFERENTIAL/PLATELET
Abs Immature Granulocytes: 0.03 K/uL (ref 0.00–0.07)
Basophils Absolute: 0.1 K/uL (ref 0.0–0.1)
Basophils Relative: 1 %
Eosinophils Absolute: 0.5 K/uL (ref 0.0–0.5)
Eosinophils Relative: 4 %
HCT: 40.2 % (ref 36.0–46.0)
Hemoglobin: 12.8 g/dL (ref 12.0–15.0)
Immature Granulocytes: 0 %
Lymphocytes Relative: 28 %
Lymphs Abs: 3.2 K/uL (ref 0.7–4.0)
MCH: 29.2 pg (ref 26.0–34.0)
MCHC: 31.8 g/dL (ref 30.0–36.0)
MCV: 91.8 fL (ref 80.0–100.0)
Monocytes Absolute: 0.9 K/uL (ref 0.1–1.0)
Monocytes Relative: 8 %
Neutro Abs: 6.7 K/uL (ref 1.7–7.7)
Neutrophils Relative %: 59 %
Platelets: 394 K/uL (ref 150–400)
RBC: 4.38 MIL/uL (ref 3.87–5.11)
RDW: 12.3 % (ref 11.5–15.5)
WBC: 11.3 K/uL — ABNORMAL HIGH (ref 4.0–10.5)
nRBC: 0 % (ref 0.0–0.2)

## 2024-10-22 LAB — COMPREHENSIVE METABOLIC PANEL WITH GFR
ALT: 24 U/L (ref 0–44)
AST: 24 U/L (ref 15–41)
Albumin: 4.2 g/dL (ref 3.5–5.0)
Alkaline Phosphatase: 150 U/L — ABNORMAL HIGH (ref 38–126)
Anion gap: 14 (ref 5–15)
BUN: 14 mg/dL (ref 6–20)
CO2: 24 mmol/L (ref 22–32)
Calcium: 9.2 mg/dL (ref 8.9–10.3)
Chloride: 101 mmol/L (ref 98–111)
Creatinine, Ser: 0.73 mg/dL (ref 0.44–1.00)
GFR, Estimated: >60 mL/min (ref >60)
Glucose, Bld: 94 mg/dL (ref 70–99)
Potassium: 4 mmol/L (ref 3.5–5.1)
Sodium: 139 mmol/L (ref 135–145)
Total Bilirubin: 0.4 mg/dL (ref 0.0–1.2)
Total Protein: 7.3 g/dL (ref 6.5–8.1)

## 2024-10-22 MED ORDER — ONDANSETRON 4 MG PO TBDP
4.0000 mg | ORAL_TABLET | Freq: Once | ORAL | Status: AC
  Administered 2024-10-22: 4 mg via ORAL
  Filled 2024-10-22: qty 1

## 2024-10-22 NOTE — Discharge Instructions (Signed)
Return to the ER for new, worsening, or persistent severe headache, dizziness, vomiting, or any other new or worsening symptoms that concern you.

## 2024-10-22 NOTE — ED Triage Notes (Addendum)
 Pt presents for a mechanical fall. Pt states she has had multiple mechanical falls recently. Struck face on concrete sidewalk. Denies LOC. Swelling noted to right eye. Endorsing nausea and lightheadedness. Denies vomiting or vision changes.

## 2024-10-22 NOTE — ED Provider Notes (Signed)
 [facility] Provider Note    Event Date/Time   First MD Initiated Contact with Patient 10/22/24 1932     (approximate)   History   Fall   HPI  Sabrina Palmer is a 53 y.o. female with a history of hypertension, diabetes, arthritis, depression, and anxiety who presents with head injury after a fall from standing height.  The patient states that she was walking into a [employer] and her foot went down into some mulch, causing her to lose her balance and fall forward.  She hit her knees on the ground as well as her right eyebrow area.  She did not lose consciousness.  She reports feeling somewhat lightheaded and nauseous.  She took ibuprofen at home which improved the headache.  She has been walking and bearing weight on both legs.  She states that she had another fall a few weeks ago at home when she tripped over something on the floor while carrying laundry.  She denies feeling dizzy, lightheaded, or weak before the falls.  I reviewed the past medical records.  The patient was seen by her primary care provider Dr. Null on 10/15 for a general physical exam.   Physical Exam   Triage Vital Signs: ED Triage Vitals  Encounter Vitals Group     BP 10/22/24 1919 130/74     Girls Systolic BP Percentile --      Girls Diastolic BP Percentile --      Boys Systolic BP Percentile --      Boys Diastolic BP Percentile --      Pulse Rate 10/22/24 1919 72     Resp 10/22/24 1919 18     Temp 10/22/24 1919 97.8 F (36.6 C)     Temp Source 10/22/24 1919 Oral     SpO2 10/22/24 1919 100 %     Weight 10/22/24 1917 221 lb (100.2 kg)     Height 10/22/24 1917 5' 7 (1.702 m)     Head Circumference --      Peak Flow --      Pain Score 10/22/24 1917 4     Pain Loc --      Pain Education --      Exclude from Growth Chart --     Most recent vital signs: Vitals:   10/22/24 1919  BP: 130/74  Pulse: 72  Resp: 18  Temp: 97.8 F (36.6 C)  SpO2: 100%      General: Alert and oriented, no distress.  CV:  Good peripheral perfusion.  Resp:  Normal effort.  Abd:  No distention.  Other:  EOMI.  PERRLA.  No photophobia.  Right eyebrow/periorbital mild swelling.  No lacerations.  Motor intact in all extremities.  No ataxia on finger-to-nose.  Normal speech.   ED Results / Procedures / Treatments   Labs (all labs ordered are listed, but only abnormal results are displayed) Labs Reviewed  CBC WITH DIFFERENTIAL/PLATELET - Abnormal; Notable for the following components:      Result Value   WBC 11.3 (*)    All other components within normal limits  COMPREHENSIVE METABOLIC PANEL WITH GFR - Abnormal; Notable for the following components:   Alkaline Phosphatase 150 (*)    All other components within normal limits     EKG     RADIOLOGY  CT head: I independently viewed and interpreted the images; there is no ICH.  Radiology report indicates no acute intracranial abnormality.  CT cervical spine: No acute  fracture  CT maxillofacial: No acute fracture  PROCEDURES:  Critical Care performed: No  Procedures   MEDICATIONS ORDERED IN ED: Medications  ondansetron (ZOFRAN-ODT) disintegrating tablet 4 mg (4 mg Oral Given 10/22/24 2004)     IMPRESSION / MDM / ASSESSMENT AND PLAN / ED COURSE  I reviewed the triage vital signs and the nursing notes.  53 year old female with PMH as noted above presents with a head injury after mechanical fall from standing height.  She had a mechanical fall several weeks ago when she tripped over something on the floor.  She has no lightheadedness or presyncope before these falls, and they have only happened twice.  On exam currently she is overall well-appearing.  Vital signs are normal.  Thorough neurologic exam is nonfocal.  She has right eyebrow area swelling with no other acute traumatic findings.  Differential diagnosis includes, but is not limited to, concussion, minor head injury, less likely TBI.   We will obtain CT head, maxillofacial, and cervical spine.  Basic labs were ordered from triage.  Patient's presentation is most consistent with acute presentation with potential threat to life or bodily function.  ----------------------------------------- 9:09 PM on 10/22/2024 -----------------------------------------  CTs are negative for acute traumatic findings.  CMP and CBC showed no acute abnormalities.  The patient is stable for discharge home.  I counseled her on the results of the workup and on the return precautions.  They expressed understanding.   FINAL CLINICAL IMPRESSION(S) / ED DIAGNOSES   Final diagnoses:  Fall, initial encounter  Contusion of face, initial encounter  Concussion without loss of consciousness, initial encounter     Rx / DC Orders   ED Discharge Orders     None        Note:  This document was prepared using Dragon voice recognition software and may include unintentional dictation errors.    Jacolyn Pae, MD 10/22/24 2109

## 2024-10-31 ENCOUNTER — Ambulatory Visit
Admission: RE | Admit: 2024-10-31 | Discharge: 2024-10-31 | Disposition: A | Discharge status: Home / Self Care | Source: Ambulatory Visit | Attending: Family Medicine | Admitting: Family Medicine

## 2024-10-31 DIAGNOSIS — Z1231 Encounter for screening mammogram for malignant neoplasm of breast: Secondary | ICD-10-CM | POA: Insufficient documentation

## 2024-11-25 ENCOUNTER — Encounter: Payer: Self-pay | Admitting: Internal Medicine

## 2024-11-25 ENCOUNTER — Other Ambulatory Visit

## 2024-11-25 ENCOUNTER — Ambulatory Visit: Admitting: Internal Medicine

## 2024-11-25 VITALS — BP 120/64 | HR 82 | Ht 67.0 in | Wt 226.4 lb

## 2024-11-25 DIAGNOSIS — E0500 Thyrotoxicosis with diffuse goiter without thyrotoxic crisis or storm: Secondary | ICD-10-CM | POA: Diagnosis not present

## 2024-11-25 DIAGNOSIS — G629 Polyneuropathy, unspecified: Secondary | ICD-10-CM

## 2024-11-25 DIAGNOSIS — E041 Nontoxic single thyroid nodule: Secondary | ICD-10-CM

## 2024-11-25 DIAGNOSIS — E1165 Type 2 diabetes mellitus with hyperglycemia: Secondary | ICD-10-CM

## 2024-11-25 DIAGNOSIS — Z7985 Long-term (current) use of injectable non-insulin antidiabetic drugs: Secondary | ICD-10-CM

## 2024-11-25 LAB — POCT GLYCOSYLATED HEMOGLOBIN (HGB A1C): Hemoglobin A1C: 5.6 % (ref 4.0–5.6)

## 2024-11-25 NOTE — Patient Instructions (Addendum)
 Please continue: - Ozempic  1 mg weekly  Continue Methimazole  5 mg every other day.  Please stop at the lab.  Please start: - Alpha lipoic acid 600 mg 2x a day  Please come back for a follow-up appointment in 6 months.
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
## 2024-11-26 LAB — MICROALBUMIN / CREATININE URINE RATIO
Creatinine, Urine: 134 mg/dL (ref 20–275)
Microalb Creat Ratio: 16 mg/g{creat}
Microalb, Ur: 2.2 mg/dL

## 2024-11-29 ENCOUNTER — Ambulatory Visit: Payer: Self-pay | Admitting: Internal Medicine

## 2024-11-29 LAB — LIPID PANEL W/REFLEX DIRECT LDL
Cholesterol: 163 mg/dL
HDL: 49 mg/dL — ABNORMAL LOW
LDL Cholesterol (Calc): 95 mg/dL
Non-HDL Cholesterol (Calc): 114 mg/dL
Total CHOL/HDL Ratio: 3.3 (calc)
Triglycerides: 96 mg/dL

## 2024-11-29 LAB — VITAMIN B12: Vitamin B-12: 408 pg/mL (ref 200–1100)

## 2024-11-29 LAB — T3, FREE: T3, Free: 3.1 pg/mL (ref 2.3–4.2)

## 2024-11-29 LAB — TSH: TSH: 1.82 m[IU]/L

## 2024-11-29 LAB — THYROID STIMULATING IMMUNOGLOBULIN: TSI: 204 %{baseline} — ABNORMAL HIGH

## 2024-11-29 LAB — T4, FREE: Free T4: 1 ng/dL (ref 0.8–1.8)

## 2024-12-19 ENCOUNTER — Other Ambulatory Visit: Payer: Self-pay | Admitting: Physical Medicine & Rehabilitation

## 2024-12-19 DIAGNOSIS — G8929 Other chronic pain: Secondary | ICD-10-CM

## 2024-12-19 DIAGNOSIS — M542 Cervicalgia: Secondary | ICD-10-CM

## 2024-12-20 ENCOUNTER — Other Ambulatory Visit: Payer: Self-pay | Admitting: Internal Medicine

## 2024-12-20 DIAGNOSIS — E1165 Type 2 diabetes mellitus with hyperglycemia: Secondary | ICD-10-CM

## 2024-12-21 ENCOUNTER — Ambulatory Visit

## 2024-12-23 ENCOUNTER — Ambulatory Visit
Admission: RE | Admit: 2024-12-23 | Discharge: 2024-12-23 | Disposition: A | Discharge status: Home / Self Care | Source: Ambulatory Visit | Attending: Physical Medicine & Rehabilitation | Admitting: Physical Medicine & Rehabilitation

## 2024-12-23 DIAGNOSIS — M542 Cervicalgia: Secondary | ICD-10-CM

## 2024-12-23 DIAGNOSIS — G8929 Other chronic pain: Secondary | ICD-10-CM

## 2024-12-27 ENCOUNTER — Ambulatory Visit

## 2024-12-29 ENCOUNTER — Ambulatory Visit

## 2024-12-30 ENCOUNTER — Other Ambulatory Visit: Payer: Self-pay | Admitting: Physical Medicine & Rehabilitation

## 2024-12-30 DIAGNOSIS — E041 Nontoxic single thyroid nodule: Secondary | ICD-10-CM

## 2025-01-03 ENCOUNTER — Ambulatory Visit

## 2025-01-05 ENCOUNTER — Ambulatory Visit

## 2025-01-10 ENCOUNTER — Ambulatory Visit

## 2025-01-12 ENCOUNTER — Ambulatory Visit

## 2025-01-17 ENCOUNTER — Other Ambulatory Visit

## 2025-01-18 ENCOUNTER — Ambulatory Visit

## 2025-01-24 ENCOUNTER — Ambulatory Visit

## 2025-01-26 ENCOUNTER — Ambulatory Visit

## 2025-05-26 ENCOUNTER — Ambulatory Visit: Admitting: Internal Medicine
